# Patient Record
Sex: Male | Born: 1960 | Race: Black or African American | Hispanic: No | Marital: Single | Smoking: Former smoker
Health system: Southern US, Community
[De-identification: ages and names within clinical notes are randomized; demographics above are authoritative.]

## PROBLEM LIST (undated history)

## (undated) DIAGNOSIS — C801 Malignant (primary) neoplasm, unspecified: Secondary | ICD-10-CM

## (undated) DIAGNOSIS — M1990 Unspecified osteoarthritis, unspecified site: Secondary | ICD-10-CM

## (undated) DIAGNOSIS — F419 Anxiety disorder, unspecified: Secondary | ICD-10-CM

## (undated) HISTORY — PX: OTHER SURGICAL HISTORY: SHX169

## (undated) HISTORY — PX: THYROID SURGERY: SHX805

## (undated) HISTORY — PX: HERNIA REPAIR: SHX51

---

## 2013-01-06 ENCOUNTER — Emergency Department (HOSPITAL_COMMUNITY)
Admission: EM | Admit: 2013-01-06 | Discharge: 2013-01-06 | Disposition: A | Discharge status: Home / Self Care | Payer: Self-pay | Attending: Emergency Medicine | Admitting: Emergency Medicine

## 2013-01-06 ENCOUNTER — Emergency Department (HOSPITAL_COMMUNITY): Payer: Self-pay

## 2013-01-06 ENCOUNTER — Encounter (HOSPITAL_COMMUNITY): Payer: Self-pay

## 2013-01-06 DIAGNOSIS — Z79899 Other long term (current) drug therapy: Secondary | ICD-10-CM | POA: Insufficient documentation

## 2013-01-06 DIAGNOSIS — Z8739 Personal history of other diseases of the musculoskeletal system and connective tissue: Secondary | ICD-10-CM | POA: Insufficient documentation

## 2013-01-06 DIAGNOSIS — Y939 Activity, unspecified: Secondary | ICD-10-CM | POA: Insufficient documentation

## 2013-01-06 DIAGNOSIS — X58XXXA Exposure to other specified factors, initial encounter: Secondary | ICD-10-CM | POA: Insufficient documentation

## 2013-01-06 DIAGNOSIS — Z85850 Personal history of malignant neoplasm of thyroid: Secondary | ICD-10-CM | POA: Insufficient documentation

## 2013-01-06 DIAGNOSIS — S90211A Contusion of right great toe with damage to nail, initial encounter: Secondary | ICD-10-CM

## 2013-01-06 DIAGNOSIS — S90129A Contusion of unspecified lesser toe(s) without damage to nail, initial encounter: Secondary | ICD-10-CM | POA: Insufficient documentation

## 2013-01-06 DIAGNOSIS — Y929 Unspecified place or not applicable: Secondary | ICD-10-CM | POA: Insufficient documentation

## 2013-01-06 HISTORY — DX: Malignant (primary) neoplasm, unspecified: C80.1

## 2013-01-06 HISTORY — DX: Unspecified osteoarthritis, unspecified site: M19.90

## 2013-01-06 NOTE — ED Provider Notes (Signed)
CSN: [id number]     Arrival date & time 01/06/13  1427 History   First MD Initiated Contact with Patient 01/06/13 1516     Chief Complaint  Patient presents with  . Toe Pain   (Consider location/radiation/quality/duration/timing/severity/associated sxs/prior Treatment) Patient is a 52 y.o. male presenting with toe pain. The history is provided by the patient.  Toe Pain This is a new problem. The current episode started more than 1 month ago. The problem occurs constantly. Pertinent negatives include no chills, fever, headaches, nausea or vomiting.   Divine Straughter is a 52 y.o. male who presents to the ED with discolored toe nail of the right great toe. He injured the to about a month ago and since then the nail has been changing color. The nail is loose. He came today to be sure there is noting bad going on.   Past Medical History  Diagnosis Date  . Arthritis   . Cancer     thyroid cancer   Past Surgical History  Procedure Laterality Date  . Thyroid surgery     No family history on file. History  Substance Use Topics  . Smoking status: Never Smoker   . Smokeless tobacco: Not on file  . Alcohol Use: Yes     Comment: occ    Review of Systems  Constitutional: Negative for fever and chills.  Gastrointestinal: Negative for nausea and vomiting.  Skin: Positive for wound.  Neurological: Negative for headaches.  Psychiatric/Behavioral: The patient is not nervous/anxious.     Allergies  Review of patient's allergies indicates no known allergies.  Home Medications   Current Outpatient Rx  Name  Route  Sig  Dispense  Refill  . calcitRIOL (ROCALTROL) 0.5 MCG capsule   Oral   Take 0.5 mcg by mouth daily.         . Calcium Carbonate-Vitamin D (CALCIUM 600 + D PO)   Oral   Take 1 tablet by mouth daily.         Marland Kitchen levothyroxine (SYNTHROID, LEVOTHROID) 125 MCG tablet   Oral   Take 125 mcg by mouth daily before breakfast.          BP 116/61  Pulse 82  Temp(Src) 97.9  F (36.6 C) (Oral)  Resp 18  Ht 6' (1.829 m)  Wt 165 lb (74.844 kg)  BMI 22.37 kg/m2  SpO2 99% Physical Exam  Nursing note and vitals reviewed. Constitutional: He is oriented to person, place, and time. He appears well-developed and well-nourished. No distress.  HENT:  Head: Normocephalic.  Eyes: EOM are normal.  Neck: Neck supple.  Cardiovascular: Normal rate.   Pulmonary/Chest: Effort normal.  Musculoskeletal:       Right foot: He exhibits normal range of motion, no tenderness, no swelling, no deformity and no laceration.       Feet:  Right great toe with yellow/blue color under toe nail.   Neurological: He is alert and oriented to person, place, and time. No cranial nerve deficit.  Skin: Skin is warm and dry.  Psychiatric: He has a normal mood and affect. His behavior is normal.    ED Course  Procedures   Using cautery stick make small hole in nail. No drainage after puncture.  Dg Toe Great Right  01/06/2013   *RADIOLOGY REPORT*  Clinical Data: Injury 1 month ago.  Discoloration great toe.  RIGHT GREAT TOE  Comparison: None.  Findings: Soft tissue prominence right great toe may indicate normal finding or cellulitis.  No findings  of osteomyelitis or fracture.  Prior hallux valgus surgery with screw in place.  IMPRESSION: Soft tissue prominence right great toe may indicate normal finding or cellulitis.  No findings of osteomyelitis or fracture.  Prior hallux valgus surgery with screw in place.   Original Report Authenticated By: Lacy Duverney, M.D.    MDM  52 y.o. male with contusion to the right great toe, subungual hematoma. I have reviewed this patient's vital signs, nurses notes, appropriate labs and imaging.  I have discussed findings with the patient and plan of care. He voices understanding.     [facility] Orlene Och, NP 01/06/13 1700

## 2013-01-06 NOTE — ED Notes (Signed)
Pt reports r great toe started out with dark discoloration back in July.  Reports now toenail is coming loose from the toenail bed.  Denies any pain.

## 2013-01-06 NOTE — ED Notes (Addendum)
Pt presents with discoloration to right great toenail. Pt states he first noticed color change 1-2 months ago. Pt denies injury.

## 2013-01-07 NOTE — ED Provider Notes (Signed)
Medical screening examination/treatment/procedure(s) were performed by non-physician practitioner and as supervising physician I was immediately available for consultation/collaboration.   Laray Anger, DO 01/07/13 1424

## 2013-06-01 ENCOUNTER — Ambulatory Visit (INDEPENDENT_AMBULATORY_CARE_PROVIDER_SITE_OTHER): Payer: Medicaid Other | Admitting: Internal Medicine

## 2013-06-01 ENCOUNTER — Encounter: Payer: Self-pay | Admitting: Internal Medicine

## 2013-06-01 VITALS — BP 122/76 | HR 78 | Temp 98.3°F | Resp 14 | Ht 72.0 in | Wt 156.6 lb

## 2013-06-01 DIAGNOSIS — C73 Malignant neoplasm of thyroid gland: Secondary | ICD-10-CM

## 2013-06-01 DIAGNOSIS — E890 Postprocedural hypothyroidism: Secondary | ICD-10-CM

## 2013-06-01 LAB — T4, FREE: Free T4: 1.27 ng/dL (ref 0.60–1.60)

## 2013-06-01 LAB — TSH: TSH: 0.13 u[IU]/mL — ABNORMAL LOW (ref 0.35–5.50)

## 2013-06-01 MED ORDER — LEVOTHYROXINE SODIUM 100 MCG PO TABS: 100.0000 ug | ORAL_TABLET | Freq: Every day | ORAL | Status: DC

## 2013-06-01 NOTE — Progress Notes (Signed)
Patient ID: Alexander Horne, male   DOB: 12-Nov-1960, 53 y.o.   MRN: [id number]   HPI  Alexander Horne is a 53 y.o.-year-old male, referred by his PCP, Dr.Robyn Baird Cancer, for evaluation for thyroid cancer in remission and  post-surgical hypothyroidism.  Pt. has been dx with thyroid cancer in 05/13/2012 in [place] - no records. In 06/2012 >> thyroidectomy. No followup afterwards.  The cancer was an incidental finding on the cervical CT for neck pain. He remembers being told that the cancer was very small. He also remembers that his LN were negative. He did not have to have RAI tx afterwards.   He has postsurgical hypothyroidism and is on Levothyroxine 125 mcg, taken: - fasting - he was waking up at 4 am just to take his levothyroxine - with water - separated by 5h from b'fast  - separated by >4h from calcium - no iron, PPIs - takes MVI after b'fast, 5h after Levothyroxine  I reviewed pt's thyroid tests:  04/26/2013: TSH 0.011  After this result returned, the patient was advised to change the dose of his levothyroxine from 125 to 112 mcg daily. He has done this, however ran out of the one-month prescription 2 days ago. He is taking levothyroxine 125 for the last 2 days.  Pt denies feeling nodules in neck, + hoarseness - after the surgery (L vocal cord weak), dysphagia/odynophagia, SOB with lying down.  Pt describes: - no heat/cold intolerance - + weight loss (196 in 04/2009 >> 156 now). He eats a lot but does not gain weight.  - no palpitations - no fatigue - no diarrhea/ no constipation - + dry skin on knees - no hair falling - no depression  She has no FH of thyroid disorders. No FH of thyroid cancer. No h/o radiation tx to head or neck.  ROS: Constitutional: see HPI Eyes: no blurry vision, no xerophthalmia ENT: no sore throat, + nodules palpated in throat, no dysphagia/odynophagia, no hoarseness Cardiovascular: no CP/SOB/palpitations/leg swelling Respiratory: no  cough/SOB Gastrointestinal: no N/V/D/C Musculoskeletal: + muscle/+ joint aches Skin: no rashes Neurological: no tremors/numbness/tingling/dizziness Psychiatric: + both depression/anxiety  Past Medical History  Diagnosis Date  . Arthritis   . Cancer     thyroid cancer   Past Surgical History  Procedure Laterality Date  . Thyroid surgery     History   Social History  . Marital Status: Single    Spouse Name: N/A    Number of Children: 3   Occupational History  . Disabled - severe hip arthritis   Social History Main Topics  . Smoking status: Quit in 1992  . Smokeless tobacco: Not on file  . Alcohol Use: Yes     Comment: Vodka - weekend - several drinks  . Drug Use: Yes, marijuana - daily  Exercise: walk, bike - daily  Current Outpatient Prescriptions on File Prior to Visit  Medication Sig Dispense Refill  . Calcium Carbonate-Vitamin D (CALCIUM 600 + D PO) Take 1 tablet by mouth daily.       No current facility-administered medications on file prior to visit.   No Known Allergies No family history on file.  PE: BP 122/76  Pulse 78  Temp(Src) 98.3 F (36.8 C)  Resp 14  Ht 6' (1.829 m)  Wt 156 lb 9.6 oz (71.033 kg)  BMI 21.23 kg/m2  SpO2 97% Wt Readings from Last 3 Encounters:  06/01/13 156 lb 9.6 oz (71.033 kg)  01/06/13 165 lb (74.844 kg)   Constitutional: overweight, in NAD  Eyes: PERRLA, EOMI, no exophthalmos ENT: moist mucous membranes, no thyromegaly, no cervical lymphadenopathy Cardiovascular: RRR, No MRG Respiratory: CTA B Gastrointestinal: abdomen soft, NT, ND, BS+ Musculoskeletal: no deformities, strength intact in all 4 Skin: moist, warm, no rashes Neurological: no tremor with outstretched hands, DTR normal in all 4  ASSESSMENT: 1. Hypothyroidism  PLAN:  1. Patient with postsurgical hypothyroidism after total thyroidectomy for low risk ThyCA. He will bring me records from his surgeon in [place].  - he is on levothyroxine therapy, with a recent  very suppressed TSH (on a dose that was not changed or verified since starting the med, per his report). He has unintentional weight loss on this dose.  - We discussed about correct intake of levothyroxine, fasting, with water, separated by at least 30 minutes from breakfast, and separated by more than 4 hours from calcium, iron, multivitamins, acid reflux medications (PPIs). I advised him that he does not need to set his alarm at 4 pm only to take the Levothyroxine! - He does not appear to have a thyroid nodules, or neck compression symptoms - we'll check thyroid tests today: TSH, free T4 - will need a new Rx for Levothyroxine  - I will see him back in 6 months, but he likely will need repeat labs sooner  Office Visit on 06/01/2013  Component Date Value Range Status  . TSH 06/01/2013 0.13* 0.35 - 5.50 uIU/mL Final  . Free T4 06/01/2013 1.27  0.60 - 1.60 ng/dL Final   Will start Levothyroxine 100 mcg >> recheck TFTs in 5 weeks.

## 2013-06-01 NOTE — Patient Instructions (Signed)
Please stop at the lab. Depending the results, we will call in a new prescription of Levothyroxine. Please return in 6 months.

## 2013-06-13 ENCOUNTER — Other Ambulatory Visit: Payer: Self-pay | Admitting: Orthopaedic Surgery

## 2013-06-14 ENCOUNTER — Encounter (HOSPITAL_COMMUNITY): Payer: Self-pay | Admitting: Pharmacy Technician

## 2013-06-19 NOTE — Pre-Procedure Instructions (Signed)
Alexander Horne  06/19/2013   Your procedure is scheduled on: Tuesday, March 3.  Report to [facility], Main Entrance / Entrance "A" at 10:20 AM.  Call this number if you have problems the morning of surgery: [phone]   Remember:   Do not eat food or drink liquids after midnight Monday.   Take these medicines the morning of surgery with A SIP OF WATER: levothyroxine (SYNTHROID, LEVOTHROID).              Stop taking Omega-3 Fatty Acids and Vitamins.    Do not wear jewelry, make-up or nail polish.  Do not wear lotions, powders, or perfumes. You may wear deodorant.             Men may shave face and neck.  Do not bring valuables to the hospital.  [facility] is not responsible for any belongings or valuables.               Contacts, dentures or bridgework may not be worn into surgery.  Leave suitcase in the car. After surgery it may be brought to your room.  For patients admitted to the hospital, discharge time is determined by your treatment team.               Special Instructions: [facility] - Preparing for Surgery  Before surgery, you can play an important role.  Because skin is not sterile, your skin needs to be as free of germs as possible.  You can reduce the number of germs on you skin by washing with CHG (chlorahexidine gluconate) soap before surgery.  CHG is an antiseptic cleaner which kills germs and bonds with the skin to continue killing germs even after washing.  Please DO NOT use if you have an allergy to CHG or antibacterial soaps.  If your skin becomes reddened/irritated stop using the CHG and inform your nurse when you arrive at Short Stay.  Do not shave (including legs and underarms) for at least 48 hours prior to the first CHG shower.  You may shave your face.  Please follow these instructions carefully:   1.  Shower with CHG Soap the night before surgery and the morning of Surgery.  2.  If you choose to wash your hair, wash your hair first as  usual with your normal shampoo.  3.  After you shampoo, rinse your hair and body thoroughly to remove the  Shampoo.  4.  Use CHG as you would any other liquid soap.  You can apply chg directlyto the skin and wash gently with scrungie or a clean washcloth.  5.  Apply the CHG Soap to your body ONLY FROM THE NECK DOWN.        Do not use on open wounds or open sores.  Avoid contact with your eyes, ears and genitals (private parts).  Wash genitals (private parts with your normal soap.  6.  Wash thoroughly, paying special attention to the area where your surgery  will be performed.  7.  Thoroughly rinse your body with warm water from the neck down.  8.  DO NOT shower/wash with your normal soap after using and rinsing of the CHG Soap.  9.  Pat yourself dry with a clean towel.            10.  Wear clean pajamas.            11.  Place clean sheets on your bed the night of your first shower and  do not sleep with pets.  Day of Surgery  Do not apply any lotions/deodorants the morning of surgery. Please wear clean clothes to the hospital/surgery center.     Please read over the following fact sheets that you were given: Pain Booklet, Coughing and Deep Breathing, Blood Transfusion Information and Surgical Site Infection Prevention

## 2013-06-20 ENCOUNTER — Inpatient Hospital Stay (HOSPITAL_COMMUNITY): Admission: RE | Admit: 2013-06-20 | Payer: Medicaid Other | Source: Ambulatory Visit

## 2013-06-21 NOTE — H&P (Signed)
TOTAL HIP ADMISSION H&P  Patient is admitted for left total hip arthroplasty.  Subjective:  Chief Complaint: left hip pain  HPI: Alexander Horne, 53 y.o. male, has a history of pain and functional disability in the left hip(s) due to arthritis and patient has failed non-surgical conservative treatments for greater than 12 weeks to include NSAID's and/or analgesics, flexibility and strengthening excercises, use of assistive devices, weight reduction as appropriate and activity modification.  Onset of symptoms was gradual starting 4 years ago with gradually worsening course since that time.The patient noted no past surgery on the left hip(s).  Patient currently rates pain in the left hip at 9 out of 10 with activity. Patient has night pain, worsening of pain with activity and weight bearing, trendelenberg gait, pain that interfers with activities of daily living and pain with passive range of motion. Patient has evidence of subchondral sclerosis, periarticular osteophytes and joint space narrowing by imaging studies. This condition presents safety issues increasing the risk of falls. This patient has had no.  There is no current active infection.  Patient Active Problem List   Diagnosis Date Noted  . Thyroid cancer 06/01/2013  . Post-surgical hypothyroidism 06/01/2013   Past Medical History  Diagnosis Date  . Arthritis   . Cancer     thyroid cancer    Past Surgical History  Procedure Laterality Date  . Thyroid surgery      No prescriptions prior to admission   No Known Allergies  History  Substance Use Topics  . Smoking status: Never Smoker   . Smokeless tobacco: Not on file  . Alcohol Use: Yes     Comment: occ    No family history on file.   Review of Systems  Constitutional: Negative.   HENT: Negative.   Eyes: Negative.   Respiratory: Negative.   Cardiovascular: Negative.   Gastrointestinal: Negative.   Genitourinary: Negative.   Musculoskeletal: Positive for joint pain.   Skin: Negative.   Neurological: Negative.   Endo/Heme/Allergies: Negative.   Psychiatric/Behavioral: Negative.     Objective:  Physical Exam  Constitutional: He appears well-developed.  HENT:  Head: Normocephalic.  Eyes: Pupils are equal, round, and reactive to light.  Neck: Normal range of motion.  Cardiovascular: Normal rate.   Respiratory: Effort normal.  GI: Soft.  Musculoskeletal:  .  Left hip examination: Very painful with any attempts of rotation which is extremely limited.  Forward flexion to 90 with a 10 hip flexion contracture.  Good neurovascular status distally.  Negative Homans.  Good sensory motor function  Neurological: He is alert.  Skin: Skin is warm.  Psychiatric: He has a normal mood and affect.    Vital signs in last 24 hours:    Labs:   Estimated body mass index is 21.23 kg/(m^2) as calculated from the following:   Height as of 06/01/13: 6' (1.829 m).   Weight as of 06/01/13: 71.033 kg (156 lb 9.6 oz).   Imaging Review Plain radiographs demonstrate severe degenerative joint disease of the left hip(s). The bone quality appears to be good for age and reported activity level.  Assessment/Plan:  End stage arthritis, left hip(s)  The patient history, physical examination, clinical judgement of the provider and imaging studies are consistent with end stage degenerative joint disease of the left hip(s) and total hip arthroplasty is deemed medically necessary. The treatment options including medical management, injection therapy, arthroscopy and arthroplasty were discussed at length. The risks and benefits of total hip arthroplasty were presented and reviewed.  The risks due to aseptic loosening, infection, stiffness, dislocation/subluxation,  thromboembolic complications and other imponderables were discussed.  The patient acknowledged the explanation, agreed to proceed with the plan and consent was signed. Patient is being admitted for inpatient treatment for  surgery, pain control, PT, OT, prophylactic antibiotics, VTE prophylaxis, progressive ambulation and ADL's and discharge planning.The patient is planning to be discharged home with home health services

## 2013-06-22 ENCOUNTER — Encounter (HOSPITAL_COMMUNITY): Payer: Self-pay

## 2013-06-22 ENCOUNTER — Encounter (HOSPITAL_COMMUNITY)
Admission: RE | Admit: 2013-06-22 | Discharge: 2013-06-22 | Disposition: A | Discharge status: Home / Self Care | Payer: Medicaid Other | Source: Ambulatory Visit | Attending: Orthopaedic Surgery | Admitting: Orthopaedic Surgery

## 2013-06-22 DIAGNOSIS — Z01810 Encounter for preprocedural cardiovascular examination: Secondary | ICD-10-CM | POA: Insufficient documentation

## 2013-06-22 DIAGNOSIS — Z01812 Encounter for preprocedural laboratory examination: Secondary | ICD-10-CM | POA: Insufficient documentation

## 2013-06-22 DIAGNOSIS — Z01818 Encounter for other preprocedural examination: Secondary | ICD-10-CM | POA: Insufficient documentation

## 2013-06-22 HISTORY — DX: Anxiety disorder, unspecified: F41.9

## 2013-06-22 LAB — URINALYSIS, ROUTINE W REFLEX MICROSCOPIC
Bilirubin Urine: NEGATIVE
Glucose, UA: NEGATIVE mg/dL
Hgb urine dipstick: NEGATIVE
Ketones, ur: NEGATIVE mg/dL
Leukocytes, UA: NEGATIVE
NITRITE: NEGATIVE
PH: 6.5 (ref 5.0–8.0)
PROTEIN: NEGATIVE mg/dL
Specific Gravity, Urine: 1.013 (ref 1.005–1.030)
Urobilinogen, UA: 0.2 mg/dL (ref 0.0–1.0)

## 2013-06-22 LAB — CBC WITH DIFFERENTIAL/PLATELET
BASOS ABS: 0 10*3/uL (ref 0.0–0.1)
Basophils Relative: 0 % (ref 0–1)
Eosinophils Absolute: 0.1 10*3/uL (ref 0.0–0.7)
Eosinophils Relative: 1 % (ref 0–5)
HCT: 36 % — ABNORMAL LOW (ref 39.0–52.0)
Hemoglobin: 12.8 g/dL — ABNORMAL LOW (ref 13.0–17.0)
LYMPHS PCT: 28 % (ref 12–46)
Lymphs Abs: 2.1 10*3/uL (ref 0.7–4.0)
MCH: 33.5 pg (ref 26.0–34.0)
MCHC: 35.6 g/dL (ref 30.0–36.0)
MCV: 94.2 fL (ref 78.0–100.0)
Monocytes Absolute: 0.8 10*3/uL (ref 0.1–1.0)
Monocytes Relative: 10 % (ref 3–12)
NEUTROS ABS: 4.6 10*3/uL (ref 1.7–7.7)
Neutrophils Relative %: 61 % (ref 43–77)
PLATELETS: 308 10*3/uL (ref 150–400)
RBC: 3.82 MIL/uL — ABNORMAL LOW (ref 4.22–5.81)
RDW: 13.8 % (ref 11.5–15.5)
WBC: 7.6 10*3/uL (ref 4.0–10.5)

## 2013-06-22 LAB — APTT: APTT: 32 s (ref 24–37)

## 2013-06-22 LAB — BASIC METABOLIC PANEL
BUN: 14 mg/dL (ref 6–23)
CHLORIDE: 104 meq/L (ref 96–112)
CO2: 28 mEq/L (ref 19–32)
Calcium: 9.3 mg/dL (ref 8.4–10.5)
Creatinine, Ser: 1.14 mg/dL (ref 0.50–1.35)
GFR calc non Af Amer: 73 mL/min — ABNORMAL LOW (ref >90)
GFR, EST AFRICAN AMERICAN: 85 mL/min — AB (ref >90)
Glucose, Bld: 85 mg/dL (ref 70–99)
POTASSIUM: 4.7 meq/L (ref 3.7–5.3)
SODIUM: 143 meq/L (ref 137–147)

## 2013-06-22 LAB — SURGICAL PCR SCREEN
MRSA, PCR: NEGATIVE
STAPHYLOCOCCUS AUREUS: NEGATIVE

## 2013-06-22 LAB — TYPE AND SCREEN
ABO/RH(D): B POS
Antibody Screen: NEGATIVE

## 2013-06-22 LAB — PROTIME-INR
INR: 0.94 (ref 0.00–1.49)
Prothrombin Time: 12.4 seconds (ref 11.6–15.2)

## 2013-06-22 LAB — ABO/RH: ABO/RH(D): B POS

## 2013-06-22 NOTE — Progress Notes (Signed)
Denies any cardiac issues.   Sees Gertie Exon @ [facility].Marland Kitchen  DA

## 2013-06-26 MED ORDER — CEFAZOLIN SODIUM-DEXTROSE 2-3 GM-% IV SOLR
2.0000 g | INTRAVENOUS | Status: AC
  Administered 2013-06-27: 2 g via INTRAVENOUS
  Filled 2013-06-26: qty 50

## 2013-06-26 NOTE — Progress Notes (Signed)
Pt made aware of time change and needs to be here @ 0600.  DA

## 2013-06-27 ENCOUNTER — Encounter (HOSPITAL_COMMUNITY): Payer: Medicaid Other | Admitting: Anesthesiology

## 2013-06-27 ENCOUNTER — Encounter (HOSPITAL_COMMUNITY): Payer: Self-pay | Admitting: Surgery

## 2013-06-27 ENCOUNTER — Inpatient Hospital Stay (HOSPITAL_COMMUNITY): Payer: Medicaid Other | Admitting: Anesthesiology

## 2013-06-27 ENCOUNTER — Inpatient Hospital Stay (HOSPITAL_COMMUNITY)
Admission: RE | Admit: 2013-06-27 | Discharge: 2013-06-29 | DRG: 470 | Disposition: A | Discharge status: Home Health | Payer: Medicaid Other | Source: Ambulatory Visit | Attending: Orthopaedic Surgery | Admitting: Orthopaedic Surgery

## 2013-06-27 ENCOUNTER — Encounter (HOSPITAL_COMMUNITY)
Admission: RE | Disposition: A | Discharge status: Home Health | Payer: Self-pay | Source: Ambulatory Visit | Attending: Orthopaedic Surgery

## 2013-06-27 ENCOUNTER — Inpatient Hospital Stay (HOSPITAL_COMMUNITY): Payer: Medicaid Other

## 2013-06-27 DIAGNOSIS — M1610 Unilateral primary osteoarthritis, unspecified hip: Principal | ICD-10-CM | POA: Diagnosis present

## 2013-06-27 DIAGNOSIS — M169 Osteoarthritis of hip, unspecified: Secondary | ICD-10-CM | POA: Diagnosis present

## 2013-06-27 DIAGNOSIS — E890 Postprocedural hypothyroidism: Secondary | ICD-10-CM | POA: Diagnosis present

## 2013-06-27 HISTORY — PX: TOTAL HIP ARTHROPLASTY: SHX124

## 2013-06-27 SURGERY — ARTHROPLASTY, HIP, TOTAL, ANTERIOR APPROACH
Anesthesia: General | Site: Hip | Laterality: Left

## 2013-06-27 MED ORDER — ROCURONIUM BROMIDE 100 MG/10ML IV SOLN
INTRAVENOUS | Status: DC | PRN
  Administered 2013-06-27: 50 mg via INTRAVENOUS

## 2013-06-27 MED ORDER — PROPOFOL 10 MG/ML IV BOLUS
INTRAVENOUS | Status: DC | PRN
Start: 2013-06-27 — End: 2013-06-27
  Administered 2013-06-27: 180 mg via INTRAVENOUS
  Administered 2013-06-27: 20 mg via INTRAVENOUS

## 2013-06-27 MED ORDER — DOCUSATE SODIUM 100 MG PO CAPS
100.0000 mg | ORAL_CAPSULE | Freq: Two times a day (BID) | ORAL | Status: DC
  Administered 2013-06-27 – 2013-06-29 (×4): 100 mg via ORAL
  Filled 2013-06-27 (×7): qty 1

## 2013-06-27 MED ORDER — LACTATED RINGERS IV SOLN: INTRAVENOUS | Status: DC

## 2013-06-27 MED ORDER — METOCLOPRAMIDE HCL 5 MG PO TABS
5.0000 mg | ORAL_TABLET | Freq: Three times a day (TID) | ORAL | Status: DC | PRN
  Filled 2013-06-27: qty 2

## 2013-06-27 MED ORDER — ONDANSETRON HCL 4 MG/2ML IJ SOLN
INTRAMUSCULAR | Status: DC | PRN
Start: 2013-06-27 — End: 2013-06-27
  Administered 2013-06-27: 4 mg via INTRAVENOUS

## 2013-06-27 MED ORDER — PROPOFOL 10 MG/ML IV BOLUS
INTRAVENOUS | Status: AC
  Filled 2013-06-27: qty 20

## 2013-06-27 MED ORDER — MENTHOL 3 MG MT LOZG: 1.0000 | LOZENGE | OROMUCOSAL | Status: DC | PRN

## 2013-06-27 MED ORDER — CHLORHEXIDINE GLUCONATE 4 % EX LIQD
60.0000 mL | Freq: Once | CUTANEOUS | Status: DC
  Filled 2013-06-27: qty 60

## 2013-06-27 MED ORDER — METHOCARBAMOL 100 MG/ML IJ SOLN
500.0000 mg | INTRAMUSCULAR | Status: AC
  Administered 2013-06-27: 500 mg via INTRAVENOUS
  Filled 2013-06-27: qty 5

## 2013-06-27 MED ORDER — CEFAZOLIN SODIUM-DEXTROSE 2-3 GM-% IV SOLR
2.0000 g | Freq: Four times a day (QID) | INTRAVENOUS | Status: AC
  Administered 2013-06-27 (×2): 2 g via INTRAVENOUS
  Filled 2013-06-27 (×2): qty 50

## 2013-06-27 MED ORDER — LACTATED RINGERS IV SOLN
INTRAVENOUS | Status: DC | PRN
  Administered 2013-06-27 (×2): via INTRAVENOUS

## 2013-06-27 MED ORDER — ALUM & MAG HYDROXIDE-SIMETH 200-200-20 MG/5ML PO SUSP: 30.0000 mL | ORAL | Status: DC | PRN

## 2013-06-27 MED ORDER — BISACODYL 5 MG PO TBEC: 5.0000 mg | DELAYED_RELEASE_TABLET | Freq: Every day | ORAL | Status: DC | PRN

## 2013-06-27 MED ORDER — METHOCARBAMOL 500 MG PO TABS
500.0000 mg | ORAL_TABLET | Freq: Four times a day (QID) | ORAL | Status: DC | PRN
  Administered 2013-06-28 (×3): 500 mg via ORAL
  Filled 2013-06-27 (×4): qty 1

## 2013-06-27 MED ORDER — SODIUM CHLORIDE 0.9 % IV SOLN
12.5000 mg | Freq: Once | INTRAVENOUS | Status: DC
  Administered 2013-06-27: 12.5 mg via INTRAVENOUS

## 2013-06-27 MED ORDER — FENTANYL CITRATE 0.05 MG/ML IJ SOLN
INTRAMUSCULAR | Status: AC
  Filled 2013-06-27: qty 5

## 2013-06-27 MED ORDER — PROMETHAZINE HCL 25 MG/ML IJ SOLN: 6.2500 mg | INTRAMUSCULAR | Status: DC | PRN

## 2013-06-27 MED ORDER — ONDANSETRON HCL 4 MG/2ML IJ SOLN
4.0000 mg | Freq: Four times a day (QID) | INTRAMUSCULAR | Status: DC | PRN
  Administered 2013-06-28 – 2013-06-29 (×2): 4 mg via INTRAVENOUS
  Filled 2013-06-27 (×2): qty 2

## 2013-06-27 MED ORDER — OXYCODONE HCL 5 MG PO TABS
5.0000 mg | ORAL_TABLET | Freq: Once | ORAL | Status: AC | PRN
  Administered 2013-06-27: 5 mg via ORAL

## 2013-06-27 MED ORDER — GLYCOPYRROLATE 0.2 MG/ML IJ SOLN
INTRAMUSCULAR | Status: DC | PRN
  Administered 2013-06-27: 0.6 mg via INTRAVENOUS

## 2013-06-27 MED ORDER — FERROUS SULFATE 325 (65 FE) MG PO TABS
325.0000 mg | ORAL_TABLET | Freq: Two times a day (BID) | ORAL | Status: DC
  Administered 2013-06-27 – 2013-06-29 (×4): 325 mg via ORAL
  Filled 2013-06-27 (×6): qty 1

## 2013-06-27 MED ORDER — ARTIFICIAL TEARS OP OINT
TOPICAL_OINTMENT | OPHTHALMIC | Status: DC | PRN
  Administered 2013-06-27: 1 via OPHTHALMIC

## 2013-06-27 MED ORDER — METHOCARBAMOL 100 MG/ML IJ SOLN
500.0000 mg | Freq: Four times a day (QID) | INTRAVENOUS | Status: DC | PRN
  Filled 2013-06-27: qty 5

## 2013-06-27 MED ORDER — ARTIFICIAL TEARS OP OINT
TOPICAL_OINTMENT | OPHTHALMIC | Status: AC
  Filled 2013-06-27: qty 3.5

## 2013-06-27 MED ORDER — OXYCODONE HCL 5 MG PO TABS
ORAL_TABLET | ORAL | Status: AC
  Filled 2013-06-27: qty 1

## 2013-06-27 MED ORDER — HYDROMORPHONE HCL PF 1 MG/ML IJ SOLN
INTRAMUSCULAR | Status: AC
  Filled 2013-06-27: qty 1

## 2013-06-27 MED ORDER — NEOSTIGMINE METHYLSULFATE 1 MG/ML IJ SOLN
INTRAMUSCULAR | Status: AC
  Filled 2013-06-27: qty 10

## 2013-06-27 MED ORDER — LEVOTHYROXINE SODIUM 100 MCG PO TABS
100.0000 ug | ORAL_TABLET | Freq: Every day | ORAL | Status: DC
  Administered 2013-06-28 – 2013-06-29 (×2): 100 ug via ORAL
  Filled 2013-06-27 (×3): qty 1

## 2013-06-27 MED ORDER — ASPIRIN EC 325 MG PO TBEC
325.0000 mg | DELAYED_RELEASE_TABLET | Freq: Two times a day (BID) | ORAL | Status: DC
  Administered 2013-06-28 – 2013-06-29 (×3): 325 mg via ORAL
  Filled 2013-06-27 (×5): qty 1

## 2013-06-27 MED ORDER — ONDANSETRON HCL 4 MG/2ML IJ SOLN
INTRAMUSCULAR | Status: AC
  Filled 2013-06-27: qty 2

## 2013-06-27 MED ORDER — LIDOCAINE HCL (CARDIAC) 20 MG/ML IV SOLN
INTRAVENOUS | Status: AC
  Filled 2013-06-27: qty 5

## 2013-06-27 MED ORDER — LACTATED RINGERS IV SOLN
INTRAVENOUS | Status: DC
  Administered 2013-06-27: 75 mL via INTRAVENOUS
  Administered 2013-06-28: via INTRAVENOUS

## 2013-06-27 MED ORDER — LIDOCAINE HCL (CARDIAC) 20 MG/ML IV SOLN
INTRAVENOUS | Status: DC | PRN
  Administered 2013-06-27: 100 mg via INTRAVENOUS

## 2013-06-27 MED ORDER — MIDAZOLAM HCL 2 MG/2ML IJ SOLN
INTRAMUSCULAR | Status: AC
  Filled 2013-06-27: qty 2

## 2013-06-27 MED ORDER — 0.9 % SODIUM CHLORIDE (POUR BTL) OPTIME
TOPICAL | Status: DC | PRN
  Administered 2013-06-27: 1000 mL

## 2013-06-27 MED ORDER — HYDROCODONE-ACETAMINOPHEN 7.5-325 MG PO TABS
1.0000 | ORAL_TABLET | ORAL | Status: DC | PRN
  Administered 2013-06-27 – 2013-06-28 (×4): 2 via ORAL
  Filled 2013-06-27 (×4): qty 2

## 2013-06-27 MED ORDER — NEOSTIGMINE METHYLSULFATE 1 MG/ML IJ SOLN
INTRAMUSCULAR | Status: DC | PRN
  Administered 2013-06-27: 4 mg via INTRAVENOUS

## 2013-06-27 MED ORDER — OXYCODONE HCL 5 MG/5ML PO SOLN: 5.0000 mg | Freq: Once | ORAL | Status: AC | PRN

## 2013-06-27 MED ORDER — MEPERIDINE HCL 25 MG/ML IJ SOLN
INTRAMUSCULAR | Status: AC
  Administered 2013-06-27: 12.5 mg via INTRAVENOUS
  Filled 2013-06-27: qty 1

## 2013-06-27 MED ORDER — MEPERIDINE HCL 25 MG/ML IJ SOLN
12.5000 mg | Freq: Once | INTRAMUSCULAR | Status: AC
Start: 2013-06-27 — End: 2013-06-27
  Administered 2013-06-27: 12.5 mg via INTRAVENOUS

## 2013-06-27 MED ORDER — METOCLOPRAMIDE HCL 5 MG/ML IJ SOLN: 5.0000 mg | Freq: Three times a day (TID) | INTRAMUSCULAR | Status: DC | PRN

## 2013-06-27 MED ORDER — ONDANSETRON HCL 4 MG PO TABS
4.0000 mg | ORAL_TABLET | Freq: Four times a day (QID) | ORAL | Status: DC | PRN
  Administered 2013-06-28: 4 mg via ORAL
  Filled 2013-06-27: qty 1

## 2013-06-27 MED ORDER — PHENOL 1.4 % MT LIQD: 1.0000 | OROMUCOSAL | Status: DC | PRN

## 2013-06-27 MED ORDER — HYDROMORPHONE HCL PF 1 MG/ML IJ SOLN
0.5000 mg | INTRAMUSCULAR | Status: DC | PRN
  Administered 2013-06-27 – 2013-06-28 (×8): 1 mg via INTRAVENOUS
  Filled 2013-06-27 (×8): qty 1

## 2013-06-27 MED ORDER — ACETAMINOPHEN 325 MG PO TABS: 650.0000 mg | ORAL_TABLET | Freq: Four times a day (QID) | ORAL | Status: DC | PRN

## 2013-06-27 MED ORDER — HYDROMORPHONE HCL PF 1 MG/ML IJ SOLN
0.2500 mg | INTRAMUSCULAR | Status: DC | PRN
  Administered 2013-06-27 (×4): 0.5 mg via INTRAVENOUS

## 2013-06-27 MED ORDER — FENTANYL CITRATE 0.05 MG/ML IJ SOLN
INTRAMUSCULAR | Status: DC | PRN
  Administered 2013-06-27: 50 ug via INTRAVENOUS
  Administered 2013-06-27: 100 ug via INTRAVENOUS
  Administered 2013-06-27: 50 ug via INTRAVENOUS
  Administered 2013-06-27: 100 ug via INTRAVENOUS

## 2013-06-27 MED ORDER — ACETAMINOPHEN 650 MG RE SUPP: 650.0000 mg | Freq: Four times a day (QID) | RECTAL | Status: DC | PRN

## 2013-06-27 MED ORDER — GLYCOPYRROLATE 0.2 MG/ML IJ SOLN
INTRAMUSCULAR | Status: AC
  Filled 2013-06-27: qty 3

## 2013-06-27 MED ORDER — MIDAZOLAM HCL 5 MG/5ML IJ SOLN
INTRAMUSCULAR | Status: DC | PRN
  Administered 2013-06-27: 2 mg via INTRAVENOUS

## 2013-06-27 SURGICAL SUPPLY — 48 items
BLADE SAW SGTL 18X1.27X75 (BLADE) ×2 IMPLANT
BLADE SAW SGTL 18X1.27X75MM (BLADE) ×1
BLADE SURG ROTATE 9660 (MISCELLANEOUS) IMPLANT
CAPT HIP PF COP ×3 IMPLANT
CELLS DAT CNTRL 66122 CELL SVR (MISCELLANEOUS) ×1 IMPLANT
COVER SURGICAL LIGHT HANDLE (MISCELLANEOUS) ×3 IMPLANT
DRAPE C-ARM 42X72 X-RAY (DRAPES) ×6 IMPLANT
DRAPE STERI IOBAN 125X83 (DRAPES) ×3 IMPLANT
DRAPE U-SHAPE 47X51 STRL (DRAPES) ×9 IMPLANT
DRSG AQUACEL AG ADV 3.5X10 (GAUZE/BANDAGES/DRESSINGS) ×3 IMPLANT
DURAPREP 26ML APPLICATOR (WOUND CARE) ×3 IMPLANT
ELECT BLADE 4.0 EZ CLEAN MEGAD (MISCELLANEOUS)
ELECT CAUTERY BLADE 6.4 (BLADE) ×3 IMPLANT
ELECT REM PT RETURN 9FT ADLT (ELECTROSURGICAL) ×3
ELECTRODE BLDE 4.0 EZ CLN MEGD (MISCELLANEOUS) IMPLANT
ELECTRODE REM PT RTRN 9FT ADLT (ELECTROSURGICAL) ×1 IMPLANT
FACESHIELD LNG OPTICON STERILE (SAFETY) ×6 IMPLANT
GLOVE BIO SURGEON STRL SZ8 (GLOVE) ×3 IMPLANT
GLOVE BIO SURGEON STRL SZ8.5 (GLOVE) ×3 IMPLANT
GLOVE BIOGEL PI IND STRL 7.5 (GLOVE) ×2 IMPLANT
GLOVE BIOGEL PI IND STRL 8 (GLOVE) ×2 IMPLANT
GLOVE BIOGEL PI IND STRL 8.5 (GLOVE) ×2 IMPLANT
GLOVE BIOGEL PI INDICATOR 7.5 (GLOVE) ×4
GLOVE BIOGEL PI INDICATOR 8 (GLOVE) ×4
GLOVE BIOGEL PI INDICATOR 8.5 (GLOVE) ×4
GOWN PREVENTION PLUS LG XLONG (DISPOSABLE) IMPLANT
GOWN STRL NON-REIN LRG LVL3 (GOWN DISPOSABLE) ×9 IMPLANT
GOWN STRL REIN XL XLG (GOWN DISPOSABLE) ×3 IMPLANT
KIT BASIN OR (CUSTOM PROCEDURE TRAY) ×3 IMPLANT
KIT ROOM TURNOVER OR (KITS) ×3 IMPLANT
MANIFOLD NEPTUNE II (INSTRUMENTS) ×3 IMPLANT
NS IRRIG 1000ML POUR BTL (IV SOLUTION) ×3 IMPLANT
PACK TOTAL JOINT (CUSTOM PROCEDURE TRAY) ×3 IMPLANT
PAD ARMBOARD 7.5X6 YLW CONV (MISCELLANEOUS) ×6 IMPLANT
RTRCTR WOUND ALEXIS 18CM MED (MISCELLANEOUS) ×3
STAPLER VISISTAT 35W (STAPLE) ×3 IMPLANT
SUT ETHIBOND NAB CT1 #1 30IN (SUTURE) ×9 IMPLANT
SUT VIC AB 0 CT1 27 (SUTURE) ×2
SUT VIC AB 0 CT1 27XBRD ANBCTR (SUTURE) ×1 IMPLANT
SUT VIC AB 1 CT1 27 (SUTURE)
SUT VIC AB 1 CT1 27XBRD ANBCTR (SUTURE) IMPLANT
SUT VIC AB 2-0 CT1 27 (SUTURE) ×2
SUT VIC AB 2-0 CT1 TAPERPNT 27 (SUTURE) ×1 IMPLANT
SUT VLOC 180 0 24IN GS25 (SUTURE) ×3 IMPLANT
TOWEL OR 17X24 6PK STRL BLUE (TOWEL DISPOSABLE) ×3 IMPLANT
TOWEL OR 17X26 10 PK STRL BLUE (TOWEL DISPOSABLE) ×6 IMPLANT
TRAY FOLEY CATH 14FR (SET/KITS/TRAYS/PACK) IMPLANT
WATER STERILE IRR 1000ML POUR (IV SOLUTION) ×6 IMPLANT

## 2013-06-27 NOTE — Transfer of Care (Signed)
Immediate Anesthesia Transfer of Care Note  Patient: Alexander Horne  Procedure(s) Performed: Procedure(s): TOTAL HIP ARTHROPLASTY ANTERIOR APPROACH (Left)  Patient Location: PACU  Anesthesia Type:General  Level of Consciousness: awake, alert , oriented and sedated  Airway & Oxygen Therapy: Patient Spontanous Breathing and Patient connected to nasal cannula oxygen  Post-op Assessment: Report given to PACU RN, Post -op Vital signs reviewed and stable and Patient moving all extremities  Post vital signs: Reviewed and stable  Complications: No apparent anesthesia complications

## 2013-06-27 NOTE — Evaluation (Signed)
Physical Therapy Evaluation Patient Details Name: Alexander Horne MRN: [id number] DOB: [birth year] Today's Date: 06/27/2013 Time: 5638-7564 PT Time Calculation (min): 15 min  PT Assessment / Plan / Recommendation History of Present Illness  s/p L direct anterior THR  Clinical Impression  Pt is s/p L direct anterior THA resulting in the deficits listed below (see PT Problem List).  Pt will benefit from skilled PT to increase their independence and safety with mobility to allow discharge to the venue listed below.  Pt mobilizing well POD #0 and plans to return home, states lives alone and will need to be modified independent upon d/c.    PT Assessment  Patient needs continued PT services    Follow Up Recommendations  Home health PT    Does the patient have the potential to tolerate intense rehabilitation      Barriers to Discharge        Equipment Recommendations  Rolling walker with 5" wheels    Recommendations for Other Services     Frequency 7X/week    Precautions / Restrictions Precautions Precautions: None Restrictions Other Position/Activity Restrictions: WBAT   Pertinent Vitals/Pain Max L hip pain, repositioned, RN notified via call bell      Mobility  Bed Mobility Overal bed mobility: Needs Assistance Bed Mobility: Supine to Sit;Sit to Supine Supine to sit: Supervision Sit to supine: Supervision General bed mobility comments: verbal cues for self assist using UEs Transfers Overall transfer level: Needs assistance Equipment used: Rolling walker (2 wheeled) Transfers: Sit to/from Stand Sit to Stand: Min guard General transfer comment: verbal cues for hand placement and L LE forward for pain control Ambulation/Gait Ambulation/Gait assistance: Min guard Ambulation Distance (Feet): 80 Feet Assistive device: Rolling walker (2 wheeled) Gait Pattern/deviations: Step-to pattern;Step-through pattern;Decreased step length - left;Antalgic General Gait Details: verbal  cues for sequence, step length, RW distance; pt very motivated despite pain and PT limited distance for decreasing pain/soreness     Exercises     PT Diagnosis: Abnormality of gait;Acute pain  PT Problem List: Decreased strength;Decreased range of motion;Decreased mobility;Decreased knowledge of use of DME;Pain PT Treatment Interventions: Stair training;Gait training;DME instruction;Functional mobility training;Patient/family education;Therapeutic activities;Therapeutic exercise     PT Goals(Current goals can be found in the care plan section) Acute Rehab PT Goals Patient Stated Goal: return home, get back to his 2 dogs PT Goal Formulation: With patient Time For Goal Achievement: 07/04/13 Potential to Achieve Goals: Good  Visit Information  Last PT Received On: 06/27/13 Assistance Needed: +1 History of Present Illness: s/p L direct anterior THR       Prior Functioning  Home Living Family/patient expects to be discharged to:: Private residence Living Arrangements: Alone Type of Home: House Home Access: Stairs to enter Technical brewer of Steps: 4 Entrance Stairs-Rails: Can reach both;Right;Left Home Layout: One level Home Equipment: None Prior Function Level of Independence: Independent Communication Communication: No difficulties    Cognition  Cognition Arousal/Alertness: Awake/alert Behavior During Therapy: WFL for tasks assessed/performed Overall Cognitive Status: Within Functional Limits for tasks assessed    Extremity/Trunk Assessment Lower Extremity Assessment Lower Extremity Assessment: LLE deficits/detail LLE Deficits / Details: decreased active hip movement against gravity observed    Balance    End of Session PT - End of Session Activity Tolerance: Patient limited by pain Patient left: in bed;with call bell/phone within reach Nurse Communication: Patient requests pain meds (via call bell)  GP     Anelle Parlow,KATHrine E 06/27/2013, 2:39 PM Carmelia Bake,  PT, DPT 06/27/2013 Pager:  [phone]   

## 2013-06-27 NOTE — Plan of Care (Signed)
Problem: Consults Goal: Diagnosis- Total Joint Replacement Primary Total Hip Left     

## 2013-06-27 NOTE — Anesthesia Postprocedure Evaluation (Signed)
Anesthesia Post Note  Patient: Alexander Horne  Procedure(s) Performed: Procedure(s) (LRB): TOTAL HIP ARTHROPLASTY ANTERIOR APPROACH (Left)  Anesthesia type: general  Patient location: PACU  Post pain: Pain level controlled  Post assessment: Patient's Cardiovascular Status Stable  Last Vitals:  Filed Vitals:   06/27/13 1121  BP: 129/88  Pulse: 61  Temp:   Resp: 8    Post vital signs: Reviewed and stable  Level of consciousness: sedated  Complications: No apparent anesthesia complications

## 2013-06-27 NOTE — Op Note (Signed)
PRE-OP DIAGNOSIS:  LEFT HIP DEGENERATIVE JOINT DISEASE POST-OP DIAGNOSIS: same PROCEDURE:  LEFT TOTAL HIP ARTHROPLASTY ANTERIOR APPROACH ANESTHESIA:  General SURGEON:  Melrose Nakayama MD ASSISTANT:  Loni Dolly PA-C and Chauncey Cruel PA-C   INDICATIONS FOR PROCEDURE:  The patient is a 53 y.o. male with a long history of a painful hip.  This has persisted despite multiple conservative measures.  The patient has persisted with pain and dysfunction making rest and activity difficult.  A total hip replacement is offered as surgical treatment.  Informed operative consent was obtained after discussion of possible complications including reaction to anesthesia, infection, neurovascular injury, dislocation, DVT, PE, and death.  The importance of the postoperative rehab program to optimize result was stressed with the patient.  SUMMARY OF FINDINGS AND PROCEDURE:  Under general anesthesia through a anterior approach an the Hana table a left THR was performed.  The patient had severe degenerative change and excellent bone quality.  We used DePuy components to replace the hip and these were size KA 10 Corail femur capped with a +1 44mm ceramic hip ball.  On the acetabular side we used a size 50 Gription shell with a plus 4 neutral polyethylene liner.  We did use a hole eliminator.  Loni Dolly PA-C assisted throughout and was invaluable to the completion of the case in that he helped position and retract while I performed the procedure.  He also closed simultaneously to help minimize OR time.  I used fluoroscopy throughout the case to check position of components and leg lengths and read all these views myself.  DESCRIPTION OF PROCEDURE:  The patient was taken to the OR suite where general anesthetic was applied.  The patient was then positioned on the Hana table supine.  All bony prominences were appropriately padded.  Prep and drape was then performed in normal sterile fashion.  The patient was given Kefzol  preoperative antibiotic and an appropriate time out was performed.  We then took an anterior approach to the right hip.  Dissection was taken through adipose to the tensor fascia lata fascia.  This structure was incised longitudinally and we dissected in the intermuscular interval just medial to this muscle.  Cobra retractors were placed superior and inferior to the femoral neck superficial to the capsule.  A capsular incision was then made and the retractors were placed along the femoral neck.  Xray was brought in to get a good level for the femoral neck cut which was made with an oscillating saw and osteotome.  The femoral head was removed with a corkscrew.  The acetabulum was exposed and some labral tissues were excised. Reaming was taken to the inside wall of the pelvis and sequentially up to 1 mm smaller than the actual component.  A trial of components was done and then the aforementioned acetabular shell was placed in appropriate tilt and anteversion confirmed by fluoroscopy. The liner was placed along with the hole eliminator and attention was turned to the femur.  The leg was brought down and over into adduction and the elevator bar was used to raise the femur up gently in the wound.  The piriformis was released with care taken to preserve the obturator internus attachment and all of the posterior capsule. The femur was reamed and then broached to the appropriate size.  A trial reduction was done and the aforementioned head and neck assembly gave [place] the best stability in extension with external rotation.  Leg lengths were felt to be about equal by  fluoroscopic exam.  The trial components were removed and the wound irrigated.  We then placed the femoral component in appropriate anteversion.  The head was applied to a dry stem neck and the hip again reduced.  It was again stable in the aforementioned position.  The would was irrigated again followed by re-approximation of anterior capsule with ethibond  suture. Tensor fascia was repaired with V-loc suture  followed by subcutaneous closure with #O and #2 undyed vicryl.  Skin was closed with staples followed by a sterile dressing.  EBL and IOF can be obtained from anesthesia records.  DISPOSITION:  The patient was extubated in the OR and taken to PACU in stable condition to be admitted to the Orthopedic Surgery for appropriate post-op care to include perioperative antibiotics and DVT prophylaxis.

## 2013-06-27 NOTE — Interval H&P Note (Signed)
History and Physical Interval Note:  06/27/2013 8:08 AM  Alexander Horne  has presented today for surgery, with the diagnosis of LEFT HIP DEGENERATIVE JOINT DISEASE  The various methods of treatment have been discussed with the patient and family. After consideration of risks, benefits and other options for treatment, the patient has consented to  Procedure(s): TOTAL HIP ARTHROPLASTY ANTERIOR APPROACH (Left) as a surgical intervention .  The patient's history has been reviewed, patient examined, no change in status, stable for surgery.  I have reviewed the patient's chart and labs.  Questions were answered to the patient's satisfaction.     Ophie Burrowes G

## 2013-06-27 NOTE — Preoperative (Signed)
Beta Blockers   Reason not to administer Beta Blockers:Not Applicable 

## 2013-06-27 NOTE — Anesthesia Preprocedure Evaluation (Addendum)
Anesthesia Evaluation  Patient identified by MRN, date of birth, ID band Patient awake    Reviewed: Allergy & Precautions, H&P , NPO status , Patient's Chart, lab work & pertinent test results  Airway Mallampati: II TM Distance: >3 FB Neck ROM: Full    Dental  (+) Dental Advisory Given, Teeth Intact   Pulmonary neg pulmonary ROS, former smoker,    Pulmonary exam normal       Cardiovascular negative cardio ROS      Neuro/Psych negative neurological ROS  negative psych ROS   GI/Hepatic negative GI ROS, Neg liver ROS,   Endo/Other  Hypothyroidism   Renal/GU negative Renal ROS     Musculoskeletal   Abdominal   Peds  Hematology   Anesthesia Other Findings   Reproductive/Obstetrics                          Anesthesia Physical Anesthesia Plan  ASA: II  Anesthesia Plan: General   Post-op Pain Management:    Induction: Intravenous  Airway Management Planned: Oral ETT  Additional Equipment:   Intra-op Plan:   Post-operative Plan: Extubation in OR  Informed Consent: I have reviewed the patients History and Physical, chart, labs and discussed the procedure including the risks, benefits and alternatives for the proposed anesthesia with the patient or authorized representative who has indicated his/her understanding and acceptance.   Dental advisory given  Plan Discussed with: CRNA, Anesthesiologist and Surgeon  Anesthesia Plan Comments:         Anesthesia Quick Evaluation

## 2013-06-27 NOTE — Progress Notes (Signed)
Utilization review completed.  

## 2013-06-28 ENCOUNTER — Encounter (HOSPITAL_COMMUNITY): Payer: Self-pay | Admitting: General Practice

## 2013-06-28 LAB — CBC
HEMATOCRIT: 30.6 % — AB (ref 39.0–52.0)
Hemoglobin: 10.6 g/dL — ABNORMAL LOW (ref 13.0–17.0)
MCH: 32.8 pg (ref 26.0–34.0)
MCHC: 34.6 g/dL (ref 30.0–36.0)
MCV: 94.7 fL (ref 78.0–100.0)
PLATELETS: 262 10*3/uL (ref 150–400)
RBC: 3.23 MIL/uL — ABNORMAL LOW (ref 4.22–5.81)
RDW: 13.3 % (ref 11.5–15.5)
WBC: 10.3 10*3/uL (ref 4.0–10.5)

## 2013-06-28 LAB — BASIC METABOLIC PANEL
BUN: 8 mg/dL (ref 6–23)
CALCIUM: 8.6 mg/dL (ref 8.4–10.5)
CO2: 27 mEq/L (ref 19–32)
Chloride: 101 mEq/L (ref 96–112)
Creatinine, Ser: 1.02 mg/dL (ref 0.50–1.35)
GFR, EST NON AFRICAN AMERICAN: 84 mL/min — AB (ref >90)
Glucose, Bld: 134 mg/dL — ABNORMAL HIGH (ref 70–99)
Potassium: 4.5 mEq/L (ref 3.7–5.3)
Sodium: 140 mEq/L (ref 137–147)

## 2013-06-28 MED ORDER — OXYCODONE-ACETAMINOPHEN 5-325 MG PO TABS
1.0000 | ORAL_TABLET | Freq: Four times a day (QID) | ORAL | Status: DC | PRN
  Administered 2013-06-28 – 2013-06-29 (×5): 2 via ORAL
  Filled 2013-06-28 (×5): qty 2

## 2013-06-28 NOTE — Progress Notes (Signed)
Subjective: 1 Day Post-Op Procedure(s) (LRB): TOTAL HIP ARTHROPLASTY ANTERIOR APPROACH (Left)  Activity level:  wbat Diet tolerance:  Eating well Voiding:  ok Patient reports pain as 3 on 0-10 scale.    Objective: Vital signs in last 24 hours: Temp:  [97 F (36.1 C)-99.8 F (37.7 C)] 99.7 F (37.6 C) (03/04 0634) Pulse Rate:  [56-96] 75 (03/04 0634) Resp:  [8-29] 16 (03/04 0634) BP: (113-143)/(61-98) 130/79 mmHg (03/04 0634) SpO2:  [93 %-100 %] 98 % (03/04 0634)  Labs:  Recent Labs  06/28/13 0450  HGB 10.6*    Recent Labs  06/28/13 0450  WBC 10.3  RBC 3.23*  HCT 30.6*  PLT 262    Recent Labs  06/28/13 0450  NA 140  K 4.5  CL 101  CO2 27  BUN 8  CREATININE 1.02  GLUCOSE 134*  CALCIUM 8.6   No results found for this basename: LABPT, INR,  in the last 72 hours  Physical Exam:  Neurologically intact ABD soft Neurovascular intact Sensation intact distally Dorsiflexion/Plantar flexion intact Incision: dressing C/D/I No cellulitis present  Assessment/Plan:  1 Day Post-Op Procedure(s) (LRB): TOTAL HIP ARTHROPLASTY ANTERIOR APPROACH (Left) Advance diet Up with therapy D/C IV fluids Plan for discharge tomorrow Discharge home with home health Switch norco to home dose oxycodone Cont ASA 325mg  x 4 weeks Follow up in office in 2 weeks    Alexander Horne, Larwance Sachs 06/28/2013, 7:46 AM

## 2013-06-28 NOTE — Progress Notes (Signed)
Physical Therapy Treatment Patient Details Name: Alexander Horne MRN: [id number] DOB: 1960-10-04 Today's Date: 06/28/2013 Time: 7867-6720 PT Time Calculation (min): 24 min  PT Assessment / Plan / Recommendation  History of Present Illness 53 yo male s/p Lt direct anterior THR   PT Comments   Pt progressing towards physical therapy goals. Was able to make good corrective changes with cueing and technique with ambulation was improved. Will continue to follow.   Follow Up Recommendations  Home health PT     Does the patient have the potential to tolerate intense rehabilitation     Barriers to Discharge        Equipment Recommendations  Rolling walker with 5" wheels    Recommendations for Other Services    Frequency 7X/week   Progress towards PT Goals Progress towards PT goals: Progressing toward goals  Plan Current plan remains appropriate    Precautions / Restrictions Precautions Precautions: None Restrictions Weight Bearing Restrictions: Yes LLE Weight Bearing: Weight bearing as tolerated   Pertinent Vitals/Pain Pt reports moderate pain throughout session however was motivated to participate.     Mobility  Bed Mobility General bed mobility comments: Pt received sitting up in chair. Transfers Overall transfer level: Needs assistance Equipment used: Rolling walker (2 wheeled) Transfers: Sit to/from Stand Sit to Stand: Supervision General transfer comment: Pt demonstrated proper hand placement and safety awareness however was impulsive in initiating transfers.  Ambulation/Gait Ambulation/Gait assistance: Min guard Ambulation Distance (Feet): 75 Feet Assistive device: Rolling walker (2 wheeled) Gait Pattern/deviations: Step-to pattern;Step-through pattern;Decreased stride length Gait velocity: Decreased Gait velocity interpretation: Below normal speed for age/gender General Gait Details: VC's for increased WB through LLE, and pt able to make corrective changes and  tolerate almost full weight bearing through the operative LE.      Exercises Total Joint Exercises Ankle Circles/Pumps: 10 reps Quad Sets: 15 reps Towel Squeeze: 15 reps Short Arc Quad: 15 reps Heel Slides: 10 reps Hip ABduction/ADduction: 15 reps Long Arc Quad: 15 reps   PT Diagnosis:    PT Problem List:   PT Treatment Interventions:     PT Goals (current goals can now be found in the care plan section) Acute Rehab PT Goals Patient Stated Goal: return home, get back to his 2 dogs PT Goal Formulation: With patient Time For Goal Achievement: 07/04/13 Potential to Achieve Goals: Good  Visit Information  Last PT Received On: 06/28/13 Assistance Needed: +1 History of Present Illness: 53 yo male s/p Lt direct anterior THR    Subjective Data  Subjective: "I need to get some rest." Patient Stated Goal: return home, get back to his 2 dogs   Cognition  Cognition Arousal/Alertness: Awake/alert Behavior During Therapy: WFL for tasks assessed/performed Overall Cognitive Status: Within Functional Limits for tasks assessed    Balance  Balance Overall balance assessment: No apparent balance deficits (not formally assessed)  End of Session PT - End of Session Equipment Utilized During Treatment: Gait belt Activity Tolerance: Patient limited by pain Patient left: in chair;with call bell/phone within reach Nurse Communication: Mobility status   GP     Jolyn Lent 06/28/2013, 2:51 PM  Jolyn Lent, PT, DPT Acute Rehabilitation Services Pager: [phone]

## 2013-06-28 NOTE — Evaluation (Signed)
Occupational Therapy Evaluation Patient Details Name: Alexander Horne MRN: [id number] DOB: [birth year] Today's Date: 06/28/2013 Time: 9833-8250 OT Time Calculation (min): 26 min  OT Assessment / Plan / Recommendation History of present illness 53 yo male s/p Lt direct anterior THR   Clinical Impression   Patient evaluated by Occupational Therapy with no further acute OT needs identified. All education has been completed and the patient has no further questions. See below for any follow-up Occupational Therapy or equipment needs. OT to sign off. Thank you for referral.      OT Assessment  Patient does not need any further OT services    Follow Up Recommendations  No OT follow up    Barriers to Discharge      Equipment Recommendations  Other (comment) (RW)    Recommendations for Other Services    Frequency       Precautions / Restrictions Precautions Precautions: None Restrictions LLE Weight Bearing: Weight bearing as tolerated   Pertinent Vitals/Pain Minimal pain  Able to complete all adls     ADL  Eating/Feeding: Modified independent Where Assessed - Eating/Feeding: Chair Grooming: Wash/dry hands;Wash/dry face;Teeth care;Supervision/safety Where Assessed - Grooming: Unsupported standing Upper Body Bathing: Chest;Right arm;Left arm;Abdomen;Supervision/safety Where Assessed - Upper Body Bathing: Unsupported sit to stand Lower Body Bathing: Supervision/safety Where Assessed - Lower Body Bathing: Unsupported sit to stand Upper Body Dressing: Supervision/safety Where Assessed - Upper Body Dressing: Unsupported standing Toilet Transfer: Supervision/safety Toilet Transfer Method: Sit to Loss adjuster, chartered: Regular height toilet Toileting - Clothing Manipulation and Hygiene: Supervision/safety Where Assessed - Best boy and Hygiene: Standing Equipment Used: Rolling walker Transfers/Ambulation Related to ADLs: Pt ambulating with heavy use  of BIL UE. pt pushing down on RW and swinging bil LE through RW face. Pt educated on pushing RW and weight bearing on BIL LE. Pt with incr gait sequence and weight bearing on RT LE this session. Pt reports "ive never used a walker before and thats what they told me to do yesterday"  ADL Comments: Pt completed bed mobility exiting on left side without deficits and this is the same side as home. Pt progressed to bathroom with RW. Pt voiding bladder standing and grooming/ bathing sink level static standing. pt ending session in chair with ice. Pt educated on transfer into van at d/c    OT Diagnosis:    OT Problem List:   OT Treatment Interventions:     OT Goals(Current goals can be found in the care plan section) Acute Rehab OT Goals Patient Stated Goal: return home, get back to his 2 dogs  Visit Information  Last OT Received On: 06/28/13 Assistance Needed: +1 History of Present Illness: 53 yo male s/p Lt direct anterior THR       Prior [place] expects to be discharged to:: Private residence Living Arrangements: Alone Available Help at Discharge: Family;Friend(s);Available PRN/intermittently Type of Home: House Home Access: Stairs to enter [employer] of Steps: 4 Entrance Stairs-Rails: Can reach both;Right;Left Home Layout: One level Home Equipment: None Prior Function Level of Independence: Independent Communication Communication: No difficulties Dominant Hand: Left         Vision/Perception Vision - History Baseline Vision: Wears glasses all the time (does not read or eat with them) Patient Visual Report: No change from baseline Vision - Assessment Vision Assessment: Vision not tested Perception Perception: Within Functional Limits Praxis Praxis: Intact   Cognition  Cognition Arousal/Alertness: Awake/alert Behavior During Therapy: WFL for tasks  assessed/performed Overall Cognitive Status: Within Functional Limits for  tasks assessed    Extremity/Trunk Assessment Upper Extremity Assessment Upper Extremity Assessment: Overall WFL for tasks assessed Lower Extremity Assessment Lower Extremity Assessment: Defer to PT evaluation Cervical / Trunk Assessment Cervical / Trunk Assessment: Normal     Mobility Bed Mobility Overal bed mobility: Modified Independent Transfers Overall transfer level: Needs assistance Equipment used: Rolling walker (2 wheeled) Transfers: Sit to/from Stand Sit to Stand: Supervision     Exercise     Balance     End of Session OT - End of Session Activity Tolerance: Patient tolerated treatment well Patient left: in chair;with call bell/phone within reach Nurse Communication: Mobility status;Precautions  GO     Peri Maris 06/28/2013, 10:21 AM Pager: [phone]

## 2013-06-28 NOTE — Progress Notes (Signed)
Physical Therapy Treatment Patient Details Name: Alexander Horne MRN: [id number] DOB: March 11, 1961 Today's Date: 06/28/2013 Time: 7829-5621 PT Time Calculation (min): 28 min  PT Assessment / Plan / Recommendation  History of Present Illness 53 yo male s/p Lt direct anterior THR   PT Comments   Pt progressing towards physical therapy goals. Limited during session due to pain from reported pinched nerve in his back. Spent end of session with positioning for comfort. Pt left in S/L with pillow in between knees and tucked behind back for support. RN notified.   Follow Up Recommendations  Home health PT     Does the patient have the potential to tolerate intense rehabilitation     Barriers to Discharge        Equipment Recommendations  Rolling walker with 5" wheels    Recommendations for Other Services    Frequency 7X/week   Progress towards PT Goals Progress towards PT goals: Progressing toward goals  Plan Current plan remains appropriate    Precautions / Restrictions Precautions Precautions: None Restrictions Weight Bearing Restrictions: Yes LLE Weight Bearing: Weight bearing as tolerated   Pertinent Vitals/Pain Increased pain at end of session (see above), RN notified for pain medication.     Mobility  Bed Mobility Overal bed mobility: Needs Assistance Bed Mobility: Supine to Sit;Sit to Supine Supine to sit: Modified independent (Device/Increase time) Sit to supine: Min assist General bed mobility comments: Pt able to transition to EOB without any physical assist, however pt with cramping and pain from a pinched nerve in his back and required min assist to elevate LE's back into bed.  Transfers Overall transfer level: Needs assistance Equipment used: Rolling walker (2 wheeled) Transfers: Sit to/from Stand Sit to Stand: Supervision General transfer comment: Pt demonstrated proper hand placement and safety awareness however was impulsive in initiating transfers.   Ambulation/Gait Ambulation/Gait assistance: Supervision;Min guard Ambulation Distance (Feet): 90 Feet Assistive device: Rolling walker (2 wheeled) Gait Pattern/deviations: Step-to pattern;Step-through pattern;Decreased stride length Gait velocity: Decreased Gait velocity interpretation: Below normal speed for age/gender General Gait Details: VC's for increased WB through LLE, and pt able to make corrective changes and tolerate almost full weight bearing through the operative LE.      Exercises Total Joint Exercises Ankle Circles/Pumps: 10 reps Quad Sets: 15 reps Towel Squeeze: 15 reps Short Arc Quad: 15 reps Heel Slides: 10 reps Hip ABduction/ADduction: 15 reps Long Arc Quad: 15 reps   PT Diagnosis:    PT Problem List:   PT Treatment Interventions:     PT Goals (current goals can now be found in the care plan section) Acute Rehab PT Goals Patient Stated Goal: return home, get back to his 2 dogs PT Goal Formulation: With patient Time For Goal Achievement: 07/04/13 Potential to Achieve Goals: Good  Visit Information  Last PT Received On: 06/28/13 Assistance Needed: +1 History of Present Illness: 53 yo male s/p Lt direct anterior THR    Subjective Data  Subjective: "I'm ready to go, let's do this." Patient Stated Goal: return home, get back to his 2 dogs   Cognition  Cognition Arousal/Alertness: Awake/alert Behavior During Therapy: WFL for tasks assessed/performed Overall Cognitive Status: Within Functional Limits for tasks assessed    Balance  Balance Overall balance assessment: No apparent balance deficits (not formally assessed) General Comments General comments (skin integrity, edema, etc.): Pt with increased pain during therapeutic exercise from a reported pinched nerve in his back. Therapeutic exercise ended early due to this.   End of Session  PT - End of Session Equipment Utilized During Treatment: Gait belt Activity Tolerance: Patient limited by pain Patient  left: in chair;with call bell/phone within reach Nurse Communication: Mobility status   GP     Jolyn Lent 06/28/2013, 4:52 PM  Jolyn Lent, PT, DPT Acute Rehabilitation Services Pager: [phone]

## 2013-06-29 LAB — CBC
HCT: 29.7 % — ABNORMAL LOW (ref 39.0–52.0)
Hemoglobin: 10.4 g/dL — ABNORMAL LOW (ref 13.0–17.0)
MCH: 33.1 pg (ref 26.0–34.0)
MCHC: 35 g/dL (ref 30.0–36.0)
MCV: 94.6 fL (ref 78.0–100.0)
PLATELETS: 252 10*3/uL (ref 150–400)
RBC: 3.14 MIL/uL — ABNORMAL LOW (ref 4.22–5.81)
RDW: 13.4 % (ref 11.5–15.5)
WBC: 11.7 10*3/uL — AB (ref 4.0–10.5)

## 2013-06-29 MED ORDER — OXYCODONE-ACETAMINOPHEN 5-325 MG PO TABS: 1.0000 | ORAL_TABLET | Freq: Four times a day (QID) | ORAL | Status: DC | PRN

## 2013-06-29 MED ORDER — ASPIRIN 325 MG PO TBEC: 325.0000 mg | DELAYED_RELEASE_TABLET | Freq: Two times a day (BID) | ORAL | Status: DC

## 2013-06-29 MED ORDER — METHOCARBAMOL 500 MG PO TABS: 500.0000 mg | ORAL_TABLET | Freq: Four times a day (QID) | ORAL | Status: DC | PRN

## 2013-06-29 NOTE — Progress Notes (Signed)
Subjective: 2 Days Post-Op Procedure(s) (LRB): TOTAL HIP ARTHROPLASTY ANTERIOR APPROACH (Left) Discharged home following therapy today. Activity level:  Weightbearing as tolerated Diet tolerance:  ok Voiding:  ok Patient reports pain as 2 on 0-10 scale.    Objective: Vital signs in last 24 hours: Temp:  [98.9 F (37.2 C)-99.8 F (37.7 C)] 99.8 F (37.7 C) (03/05 0528) Pulse Rate:  [83-106] 83 (03/05 0528) Resp:  [16] 16 (03/05 0528) BP: (104-125)/(66-80) 125/80 mmHg (03/05 0528) SpO2:  [96 %-99 %] 97 % (03/05 0528)  Labs:  Recent Labs  06/28/13 0450 06/29/13 0438  HGB 10.6* 10.4*    Recent Labs  06/28/13 0450 06/29/13 0438  WBC 10.3 11.7*  RBC 3.23* 3.14*  HCT 30.6* 29.7*  PLT 262 252    Recent Labs  06/28/13 0450  NA 140  K 4.5  CL 101  CO2 27  BUN 8  CREATININE 1.02  GLUCOSE 134*  CALCIUM 8.6   No results found for this basename: LABPT, INR,  in the last 72 hours  Physical Exam:  Neurologically intact ABD soft Neurovascular intact Sensation intact distally Intact pulses distally Dorsiflexion/Plantar flexion intact No cellulitis present Compartment soft  Assessment/Plan:  2 Days Post-Op Procedure(s) (LRB): TOTAL HIP ARTHROPLASTY ANTERIOR APPROACH (Left) Advance diet Up with therapy Discharge home with home health Discharge following therapy today. Continue ASA 3251 pill twice a day x4 weeks. Return to office in 2 weeks for recheck. Keep incision dry. May change dressing as needed. Weightbearing as tolerated no significant restrictions.    Nickol Collister R 06/29/2013, 8:39 AM

## 2013-06-29 NOTE — Discharge Summary (Signed)
Patient ID: Alexander Horne MRN: [id number] DOB/AGE: 51-May-1962 51 y.o.  Admit date: 06/27/2013 Discharge date: 06/29/2013  Admission Diagnoses:  Principal Problem:   Degenerative joint disease (DJD) of hip   Discharge Diagnoses:  Same  Past Medical History  Diagnosis Date  . Arthritis   . Cancer     thyroid cancer  . Anxiety     Surgeries: Procedure(s): TOTAL HIP ARTHROPLASTY ANTERIOR APPROACH on 06/27/2013   Consultants:    Discharged Condition: Improved  Hospital Course: Alexander Horne is an 53 y.o. male who was admitted 06/27/2013 for operative treatment ofDegenerative joint disease (DJD) of hip. Patient has severe unremitting pain that affects sleep, daily activities, and work/hobbies. After pre-op clearance the patient was taken to the operating room on 06/27/2013 and underwent  Procedure(s): TOTAL HIP ARTHROPLASTY ANTERIOR APPROACH.    Patient was given perioperative antibiotics: Anti-infectives   Start     Dose/Rate Route Frequency Ordered Stop   06/27/13 1430  ceFAZolin (ANCEF) IVPB 2 g/50 mL premix     2 g 100 mL/hr over 30 Minutes Intravenous 4 times per day 06/27/13 1214 06/27/13 1831   06/27/13 0600  ceFAZolin (ANCEF) IVPB 2 g/50 mL premix     2 g 100 mL/hr over 30 Minutes Intravenous On call to O.R. 06/26/13 1343 06/27/13 0845       Patient was given sequential compression devices, early ambulation, and chemoprophylaxis to prevent DVT.  Patient benefited maximally from hospital stay and there were no complications.    Recent vital signs: Patient Vitals for the past 24 hrs:  BP Temp Temp src Pulse Resp SpO2  06/29/13 0528 125/80 mmHg 99.8 F (37.7 C) Oral 83 16 97 %  06/29/13 0000 - - - - 16 99 %  06/28/13 2000 - - - - 16 99 %  06/28/13 1958 104/66 mmHg 99.8 F (37.7 C) Oral 106 16 99 %  06/28/13 1400 125/70 mmHg 98.9 F (37.2 C) Oral 87 16 96 %     Recent laboratory studies:  Recent Labs  06/28/13 0450 06/29/13 0438  WBC 10.3 11.7*  HGB 10.6*  10.4*  HCT 30.6* 29.7*  PLT 262 252  NA 140  --   K 4.5  --   CL 101  --   CO2 27  --   BUN 8  --   CREATININE 1.02  --   GLUCOSE 134*  --   CALCIUM 8.6  --      Discharge Medications:     Medication List         aspirin 325 MG EC tablet  Take 1 tablet (325 mg total) by mouth 2 (two) times daily after a meal.     CALCIUM 600 + D PO  Take 1 tablet by mouth daily.     levothyroxine 100 MCG tablet  Commonly known as:  SYNTHROID, LEVOTHROID  Take 1 tablet (100 mcg total) by mouth daily.     methocarbamol 500 MG tablet  Commonly known as:  ROBAXIN  Take 1 tablet (500 mg total) by mouth every 6 (six) hours as needed for muscle spasms.     OMEGA 3 PO  Take 1 capsule by mouth daily.     oxyCODONE-acetaminophen 5-325 MG per tablet  Commonly known as:  PERCOCET/ROXICET  Take 1-2 tablets by mouth every 6 (six) hours as needed for severe pain.        Diagnostic Studies: Dg Chest 2 View  06/22/2013   CLINICAL DATA:  Arthroplasty.  Preop chest.  EXAM: CHEST  2 VIEW  COMPARISON:  None.  FINDINGS: Mediastinum is normal. Left hilum is retracted superiorly suggesting scarring. Surgical clips present projected over the left upper chest. No focal infiltrate. No pleural effusion. Elevation of the left hemidiaphragm is present, possibly related to the scarring on the left. No pneumothorax. No acute bony abnormality.  IMPRESSION: No active cardiopulmonary disease. Findings suggesting scarring left lung.   Electronically Signed   By: Marcello Moores  Register   On: 06/22/2013 13:19   Dg Hip Operative Left  06/27/2013   CLINICAL DATA:  Left hip replacement surgery.  EXAM: OPERATIVE LEFT HIP  COMPARISON:  None.  FINDINGS: The femoral and acetabular components are well seated. No complicating features are demonstrated.  IMPRESSION: Well seated components of a total left hip arthroplasty.   Electronically Signed   By: Kalman Jewels M.D.   On: 06/27/2013 14:37    Disposition: 01-Home or Self Care       Discharge Orders   Future Appointments Provider Department Dept Phone   11/30/2013 10:00 AM Philemon Kingdom, MD [facility] [phone]   Future Orders Complete By Expires   Call MD / Call 911  As directed    Comments:     If you experience chest pain or shortness of breath, CALL 911 and be transported to the hospital emergency room.  If you develope a fever above 101 F, pus (white drainage) or increased drainage or redness at the wound, or calf pain, call your surgeon's office.   Constipation Prevention  As directed    Comments:     Drink plenty of fluids.  Prune juice may be helpful.  You may use a stool softener, such as Colace (over the counter) 100 mg twice a day.  Use MiraLax (over the counter) for constipation as needed.   Diet - low sodium heart healthy  As directed    Increase activity slowly as tolerated  As directed       Follow-up Information   Follow up with DALLDORF,PETER G, MD. Call in 1 week.   Specialty:  Orthopedic Surgery   Contact information:   [place] [address] [phone]        Signed: Dione Housekeeper 06/29/2013, 8:43 AM

## 2013-06-29 NOTE — Progress Notes (Signed)
Physical Therapy Treatment Patient Details Name: Alexander Horne MRN: [id number] DOB: [birth year] Today's Date: 06/29/2013 Time: 5643-3295 PT Time Calculation (min): 10 min  PT Assessment / Plan / Recommendation  History of Present Illness 53 yo male s/p Lt direct anterior THR   PT Comments   Pt standing in room packing bag upon PT arrival. Pt demonstrates improvement with pain, and modified independence with transfers, ambulation and stair negotiation. Discussed HEP and car transfer, and pt anticipates d/c this morning.   Follow Up Recommendations  Home health PT     Does the patient have the potential to tolerate intense rehabilitation     Barriers to Discharge        Equipment Recommendations  Rolling walker with 5" wheels    Recommendations for Other Services    Frequency 7X/week   Progress towards PT Goals Progress towards PT goals: Goals met/education completed, patient discharged from PT  Plan Current plan remains appropriate    Precautions / Restrictions Precautions Precautions: None Restrictions Weight Bearing Restrictions: Yes LLE Weight Bearing: Weight bearing as tolerated   Pertinent Vitals/Pain Pt reports no pain throughout session.     Mobility  Bed Mobility Overal bed mobility: Modified Independent General bed mobility comments: Increased independence this session. Transfers Overall transfer level: Modified independent General transfer comment: Pt demonstrated proper hand placement and safety awareness.  Ambulation/Gait Ambulation/Gait assistance: Modified independent (Device/Increase time) Ambulation Distance (Feet): 125 Feet Assistive device: Rolling walker (2 wheeled) Gait Pattern/deviations: WFL(Within Functional Limits) Gait velocity: Decreased Gait velocity interpretation: Below normal speed for age/gender General Gait Details: Pt demonstrated proper technique and safety with the walker. No VC's required.  Stairs: Yes Stairs assistance: Modified  independent (Device/Increase time) Stair Management: Two rails;Alternating pattern;Forwards Number of Stairs: 5 (x2) General stair comments: Pt cued for step-to pattern but was able to demonstrate safe alternating pattern with no LOB or noted knee instability.     Exercises     PT Diagnosis:    PT Problem List:   PT Treatment Interventions:     PT Goals (current goals can now be found in the care plan section) Acute Rehab PT Goals Patient Stated Goal: return home, get back to his 2 dogs PT Goal Formulation: With patient Time For Goal Achievement: 07/04/13 Potential to Achieve Goals: Good  Visit Information  Last PT Received On: 06/29/13 Assistance Needed: +1 History of Present Illness: 53 yo male s/p Lt direct anterior THR    Subjective Data  Subjective: "I'm ready to go home, just waiting on you." Patient Stated Goal: return home, get back to his 2 dogs   Cognition  Cognition Arousal/Alertness: Awake/alert Behavior During Therapy: WFL for tasks assessed/performed Overall Cognitive Status: Within Functional Limits for tasks assessed    Balance  Balance Overall balance assessment: No apparent balance deficits (not formally assessed) General Comments General comments (skin integrity, edema, etc.): Issued HEP and discussed reps/sets of each.   End of Session PT - End of Session Activity Tolerance: Patient tolerated treatment well Patient left: in chair;with call bell/phone within reach Nurse Communication: Mobility status   GP     Jolyn Lent 06/29/2013, 11:07 AM  Jolyn Lent, PT, DPT Acute Rehabilitation Services Pager: [phone]

## 2013-06-29 NOTE — Care Management Note (Signed)
CARE MANAGEMENT NOTE 06/29/2013  Patient:  Alexander Horne, Alexander Horne   Account Number:  [id number]  Date Initiated:  06/29/2013  Documentation initiated by:  Ricki Miller  Subjective/Objective Assessment:   53 yr old male s/p left total hip arthroplasty.     Action/Plan:   [facility] pre[facility] with [facility], no changes. patient has family support at discharge.   Anticipated DC Date:  06/29/2013   Anticipated DC Plan:  [place] [facility]  CM consult      [place]   Choice offered to / List presented to:  C-1 Patient   DME arranged  [place]  3-N-1      DME agency  [place] arranged  [place].   Status of service:  Completed, signed off

## 2013-11-30 ENCOUNTER — Encounter: Payer: Self-pay | Admitting: Internal Medicine

## 2013-11-30 ENCOUNTER — Ambulatory Visit (INDEPENDENT_AMBULATORY_CARE_PROVIDER_SITE_OTHER): Payer: Medicaid Other | Admitting: Internal Medicine

## 2013-11-30 VITALS — BP 110/78 | HR 77 | Temp 98.3°F | Resp 12 | Wt 159.0 lb

## 2013-11-30 DIAGNOSIS — C73 Malignant neoplasm of thyroid gland: Secondary | ICD-10-CM

## 2013-11-30 DIAGNOSIS — E890 Postprocedural hypothyroidism: Secondary | ICD-10-CM

## 2013-11-30 LAB — TSH: TSH: 0.2 u[IU]/mL — ABNORMAL LOW (ref 0.35–4.50)

## 2013-11-30 LAB — T4, FREE: FREE T4: 1.02 ng/dL (ref 0.60–1.60)

## 2013-11-30 MED ORDER — LEVOTHYROXINE SODIUM 88 MCG PO TABS: 88.0000 ug | ORAL_TABLET | Freq: Every day | ORAL | Status: DC

## 2013-11-30 NOTE — Progress Notes (Signed)
Patient ID: Alexander Horne, male   DOB: Nov 27, 1960, 53 y.o.   MRN: [id number]   HPI  Alexander Horne is a 53 y.o.-year-old male, returning for f/u for thyroid cancer in remission and  post-surgical hypothyroidism. Last visit 6 mo ago.  He had hip replacement in 06/2013 >> still has some "aches and pain".   He started iron po for anemia.  Reviewed and annotated hx - reviewed records brought today by the pt: Pt. has been dx with thyroid cancer in 05/13/2012 in [place] - pt brings records.  In 06/2012 >> patient had an incidental finding of a thyroid nodule on cervical CT performed for neck pain. This was biopsied and the result was benign, however, due to the appearance of the nodule, was suspicious, he was suggested to have surgery. He had total thyroidectomy with central compartment and modified radical neck dissection! Surgeon: Dr Philip Aspen. final pathology showed 2 foci of micro-papillary thyroid cancer, follicular variant, of 1 mm dimension, with no lymphovascular or perineural invasion and 15 lymph nodes negative for cancer. One parathyroid gland is present and also benign thymic tissue.  Pt did not have to have RAI tx afterwards. He did not have further followup.   Stage: mpT1a, pN0, pMx  He has postsurgical hypothyroidism and is now on Levothyroxine 125 mcg >> 100 mcg, taken: - fasting  - with water - eats 45-60 min b'fast  - takes calcium in the afternoon - no iron, PPIs - takes MVI with calcium  He did not return in 5 weeks for a TFT recheck after the change in LT4 as he did not get our voice message.  I reviewed pt's thyroid tests:  Lab Results  Component Value Date   TSH 0.13* 06/01/2013   FREET4 1.27 06/01/2013  04/26/2013: TSH 0.011   Pt denies feeling nodules in neck, + hoarseness - after the surgery (L vocal cord weak), dysphagia/odynophagia, SOB with lying down. Feels numbness in L neck at the surgery site.  Pt describes: - no heat/cold intolerance - resolved weight  loss (196 in 04/2009 >> 156 now). He eats a lot but does not gain weight.  - no palpitations - + fatigue - no diarrhea/ no constipation - no hair falling - no depression  ROS: Constitutional: see HPI Eyes: no blurry vision, no xerophthalmia ENT: no sore throat, no nodules palpated in throat, no dysphagia/odynophagia, no hoarseness Cardiovascular: no CP/SOB/palpitations/leg swelling Respiratory: no cough/SOB Gastrointestinal: no N/V/D/C Musculoskeletal: + muscle/+ joint aches Skin: no rashes Neurological: no tremors/numbness/tingling/dizziness  PE: BP 110/78  Pulse 77  Temp(Src) 98.3 F (36.8 C) (Oral)  Resp 12  Wt 159 lb (72.122 kg)  SpO2 97% Body mass index is 21.56 kg/(m^2).  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 Hypothyroidism  PLAN:  1. Patient with postsurgical hypothyroidism after total thyroidectomy for Microscopic follicular variant of ThyCA. He brought me records of his surgery and I reviewed these together with him. I reassured him that he is cured  of his cancer, since he only had 2 x 67mm foci of cancer that were excised - no further studies needed  2. Postsurgical hypothyroidism - he is on levothyroxine (LT4) therapy, with a recent very suppressed TSH >> decreased to 100 mcg at last visit but did not return for repeat labs - we will check TFTs today - We again discussed about correct intake of  levothyroxine, fasting, with water, separated by at least 30 minutes from breakfast, and separated by more than 4 hours from calcium, iron, multivitamins, acid reflux medications (PPIs). He is now taking it correctly- He does not appear to have a thyroid nodules, or neck compression symptoms - I will see him back in 1 year, or sooner ~ labs   Office Visit on 11/30/2013  Component Date Value Ref Range Status  . TSH 11/30/2013 0.20* 0.35 - 4.50 uIU/mL Final  . Free T4 11/30/2013 1.02  0.60 - 1.60 ng/dL Final   TSH still suppressed >> decrease LT4 to 88 mcg >> needs to return for labs in 6 weeks.

## 2013-11-30 NOTE — Patient Instructions (Signed)
Please continue the same dose of Levothyroxine until I send you the labs.  Please stop at the lab.

## 2013-12-26 ENCOUNTER — Ambulatory Visit: Payer: Self-pay | Admitting: Podiatry

## 2014-01-04 ENCOUNTER — Ambulatory Visit: Payer: Self-pay | Admitting: Podiatry

## 2014-01-16 ENCOUNTER — Ambulatory Visit (INDEPENDENT_AMBULATORY_CARE_PROVIDER_SITE_OTHER): Payer: Medicaid Other

## 2014-01-16 ENCOUNTER — Encounter: Payer: Self-pay | Admitting: Podiatry

## 2014-01-16 ENCOUNTER — Ambulatory Visit (INDEPENDENT_AMBULATORY_CARE_PROVIDER_SITE_OTHER): Payer: Medicaid Other | Admitting: Podiatry

## 2014-01-16 VITALS — BP 116/78 | HR 59 | Resp 16

## 2014-01-16 DIAGNOSIS — M7660 Achilles tendinitis, unspecified leg: Secondary | ICD-10-CM

## 2014-01-16 DIAGNOSIS — M205X2 Other deformities of toe(s) (acquired), left foot: Secondary | ICD-10-CM

## 2014-01-16 DIAGNOSIS — M778 Other enthesopathies, not elsewhere classified: Secondary | ICD-10-CM

## 2014-01-16 DIAGNOSIS — M779 Enthesopathy, unspecified: Secondary | ICD-10-CM

## 2014-01-16 MED ORDER — MELOXICAM 15 MG PO TABS: 15.0000 mg | ORAL_TABLET | Freq: Every day | ORAL | Status: AC

## 2014-01-16 MED ORDER — METHYLPREDNISOLONE (PAK) 4 MG PO TABS: ORAL_TABLET | ORAL | Status: DC

## 2014-01-16 NOTE — Progress Notes (Signed)
   Subjective:    Patient ID: Alexander Horne, male    DOB: [birth year], 53 y.o.   MRN: [id number]  HPI Comments: "I have pain in this joint"  Patient c/o aching 1st MPJ left for few years. He has had previous surgery in 2011. The area swells and gets red. The joint, visually, is very large. He is interested in orthotics and special shoes.  Foot Pain Associated symptoms include arthralgias and coughing.      Review of Systems  Respiratory: Positive for cough.   Musculoskeletal: Positive for arthralgias.  Allergic/Immunologic: Positive for food allergies.  All other systems reviewed and are negative.      Objective:   Physical Exam: I reviewed his past medical history medications allergies surgeries social history and review of systems. Pulses are strongly palpable bilateral. Neurologic sensorium is intact per Semmes-Weinstein monofilament. Deep tendon reflexes are intact bilateral muscle strength is 5 over 5 dorsiflexors plantar flexors inverters everters all intrinsic musculature is intact. Orthopedic evaluation demonstrates previous surgery to the first metatarsophalangeal joint with an elevated first metatarsal. He has an implant/joint replacement first metatarsophalangeal joint left foot. Radiographic evaluation demonstrates a distal hemiarthroplasty with an elevated first metatarsal and severe pes planus. Radiographic evaluation confirms this.        Assessment & Plan:  Assessment: Severe plantar fasciitis bilateral. Limited range of motion first metatarsophalangeal joint hallux left secondary to hallux limitus with a history of a hemiarthroplasty left. Elevated first metatarsal.  Plan: Injected Kenalog and dexamethasone with local anesthetic to the first metatarsophalangeal joint. Placed him a Medrol Dosepak. He was scan for a set of orthotics. I will followup with him once the cement.

## 2014-02-02 ENCOUNTER — Ambulatory Visit: Payer: Medicaid Other | Admitting: Internal Medicine

## 2014-02-02 ENCOUNTER — Other Ambulatory Visit (INDEPENDENT_AMBULATORY_CARE_PROVIDER_SITE_OTHER): Payer: Medicaid Other

## 2014-02-02 DIAGNOSIS — E890 Postprocedural hypothyroidism: Secondary | ICD-10-CM

## 2014-02-02 LAB — T4, FREE: FREE T4: 0.89 ng/dL (ref 0.60–1.60)

## 2014-02-02 LAB — TSH: TSH: 2 u[IU]/mL (ref 0.35–4.50)

## 2014-02-23 ENCOUNTER — Ambulatory Visit (INDEPENDENT_AMBULATORY_CARE_PROVIDER_SITE_OTHER): Payer: Medicaid Other

## 2014-02-23 DIAGNOSIS — M779 Enthesopathy, unspecified: Principal | ICD-10-CM

## 2014-02-23 DIAGNOSIS — M79673 Pain in unspecified foot: Secondary | ICD-10-CM

## 2014-02-23 DIAGNOSIS — M205X2 Other deformities of toe(s) (acquired), left foot: Secondary | ICD-10-CM

## 2014-02-23 DIAGNOSIS — M778 Other enthesopathies, not elsewhere classified: Secondary | ICD-10-CM

## 2014-02-23 NOTE — Progress Notes (Signed)
Pt is here to PUO 

## 2014-02-23 NOTE — Patient Instructions (Signed)

## 2014-07-25 ENCOUNTER — Telehealth: Payer: Self-pay | Admitting: Internal Medicine

## 2014-07-25 MED ORDER — LEVOTHYROXINE SODIUM 88 MCG PO TABS: 88.0000 ug | ORAL_TABLET | Freq: Every day | ORAL | Status: DC

## 2014-07-25 NOTE — Telephone Encounter (Signed)
Pt needs refill on levothyroxine

## 2014-07-25 NOTE — Telephone Encounter (Signed)
Rx sent to pt's request.

## 2014-07-30 ENCOUNTER — Other Ambulatory Visit: Payer: Self-pay | Admitting: Internal Medicine

## 2014-08-21 IMAGING — RF DG HIP OPERATIVE*L*
1 series · 2 of 2 positions shown · non-contrast
Comparison: None.

CLINICAL DATA: Left hip replacement surgery.

EXAM:
OPERATIVE LEFT HIP

[Series 1: run · 2 of 2 slices shown]
[im 1/2]
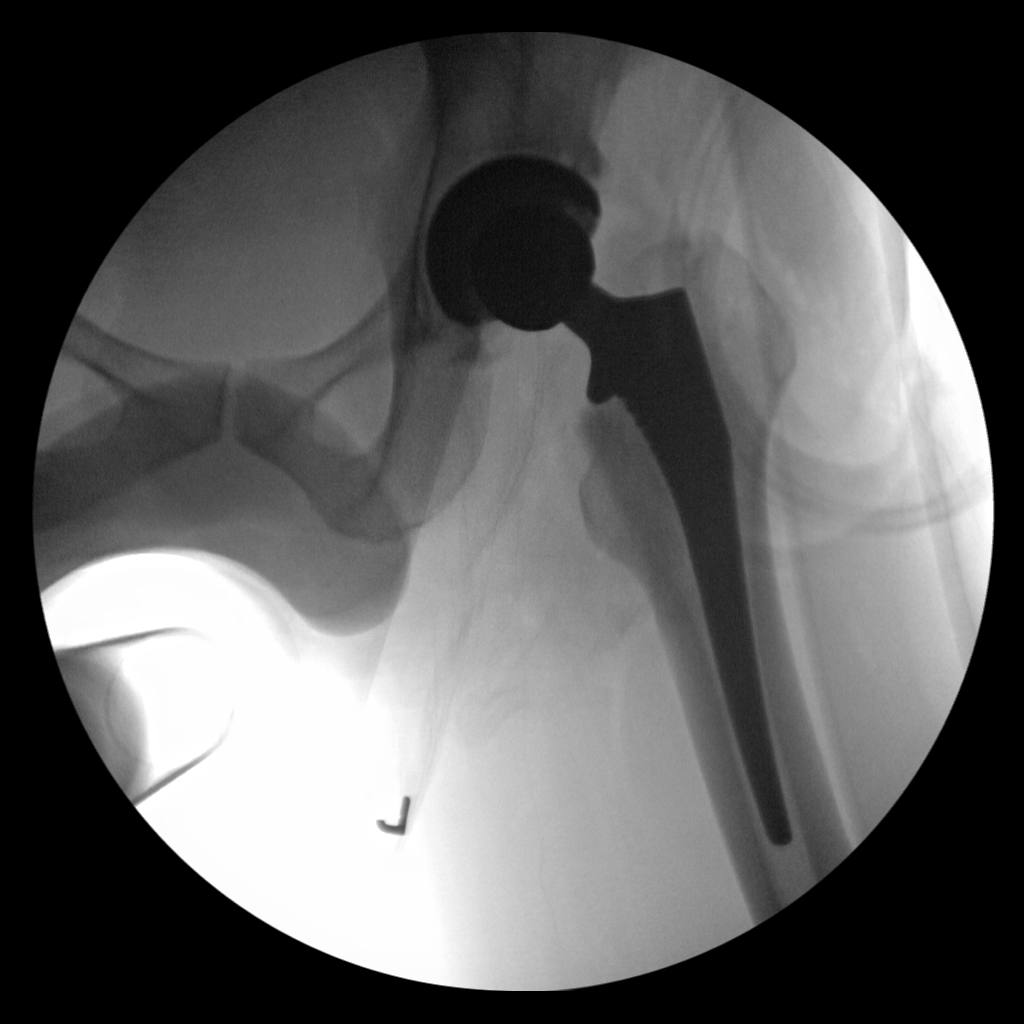
[im 2/2]
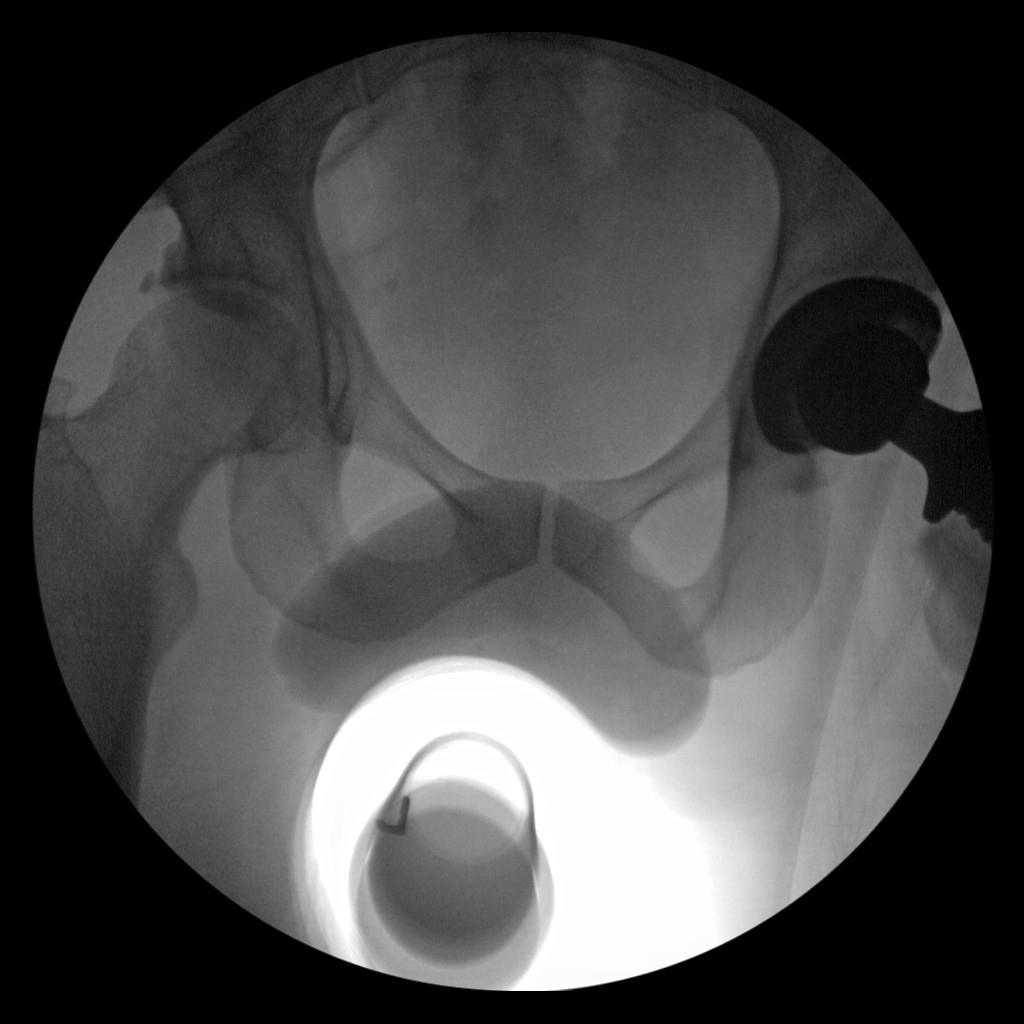

[2 of 2 positions shown; findings below may reference images not displayed]

FINDINGS: The femoral and acetabular components are well seated. No
complicating features are demonstrated.
IMPRESSION: Well seated components of a total left hip arthroplasty.

## 2014-09-04 ENCOUNTER — Other Ambulatory Visit: Payer: Self-pay | Admitting: Internal Medicine

## 2014-09-17 ENCOUNTER — Ambulatory Visit (INDEPENDENT_AMBULATORY_CARE_PROVIDER_SITE_OTHER): Payer: Medicaid Other | Admitting: Internal Medicine

## 2014-09-17 ENCOUNTER — Encounter: Payer: Self-pay | Admitting: Internal Medicine

## 2014-09-17 VITALS — BP 112/74 | HR 73 | Temp 97.7°F | Resp 12 | Wt 170.0 lb

## 2014-09-17 DIAGNOSIS — E890 Postprocedural hypothyroidism: Secondary | ICD-10-CM

## 2014-09-17 DIAGNOSIS — C73 Malignant neoplasm of thyroid gland: Secondary | ICD-10-CM

## 2014-09-17 LAB — TSH: TSH: 3.07 u[IU]/mL (ref 0.35–4.50)

## 2014-09-17 LAB — T4, FREE: Free T4: 0.81 ng/dL (ref 0.60–1.60)

## 2014-09-17 MED ORDER — LEVOTHYROXINE SODIUM 88 MCG PO TABS: ORAL_TABLET | ORAL | Status: DC

## 2014-09-17 NOTE — Progress Notes (Signed)
Patient ID: Alexander Horne, male   DOB: [birth year], 54 y.o.   MRN: [id number]   HPI  Alexander Horne is a 54 y.o.-year-old male, returning for f/u for thyroid cancer in remission and  post-surgical hypothyroidism. Last visit 9 mo ago.  Reviewed and annotated hx - reviewed records brought today by the pt: Pt. has been dx with thyroid cancer in 05/13/2012 in [place] - pt brings records.  In 06/2012 >> patient had an incidental finding of a thyroid nodule on cervical CT performed for neck pain. This was biopsied and the result was benign, however, due to the appearance of the nodule, was suspicious, he was suggested to have surgery. He had total thyroidectomy with central compartment and modified radical neck dissection! Surgeon: Dr Philip Aspen. final pathology showed 2 foci of micro-papillary thyroid cancer, follicular variant, of 1 mm dimension, with no lymphovascular or perineural invasion and 15 lymph nodes negative for cancer. One parathyroid gland is present and also benign thymic tissue.  Pt did not have to have RAI tx afterwards. He did not have further followup.   Stage: mpT1a, pN0, pMx  He has postsurgical hypothyroidism and is now on Levothyroxine 125 mcg >> 100 >> 88 mcg: - fasting  - with water - eats 45-60 min b'fast  - takes calcium in the afternoon - no iron, PPIs - takes MVI with calcium after lunch  I reviewed pt's thyroid tests:  Lab Results  Component Value Date   TSH 2.00 02/02/2014   TSH 0.20* 11/30/2013   TSH 0.13* 06/01/2013   FREET4 0.89 02/02/2014   FREET4 1.02 11/30/2013   FREET4 1.27 06/01/2013  04/26/2013: TSH 0.011   Pt denies feeling nodules in neck, + hoarseness - after the surgery (L vocal cord weak), dysphagia/odynophagia, SOB with lying down.   Pt describes: - no heat/cold intolerance - + weight gain - 11 lbs since last visit ("I like to cook and eat") - no palpitations - no fatigue - no diarrhea/ no constipation - no hair falling - no  depression  ROS: Constitutional: see HPI, + poor sleep Eyes: no blurry vision, no xerophthalmia ENT: no sore throat, no nodules palpated in throat, no dysphagia/odynophagia, no hoarseness Cardiovascular: no CP/SOB/palpitations/leg swelling Respiratory: no cough/SOB Gastrointestinal: no N/V/D/C Musculoskeletal: no muscle/joint aches Skin: no rashes Neurological: no tremors/numbness/tingling/dizziness  PE: BP 112/74 mmHg  Pulse 73  Temp(Src) 97.7 F (36.5 C) (Oral)  Resp 12  Wt 170 lb (77.111 kg)  SpO2 98% Body mass index is 23.05 kg/(m^2).  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 Hypothyroidism  PLAN:  1. Patient with postsurgical hypothyroidism after total thyroidectomy for Microscopic follicular variant of ThyCA.  - we discussed that this particular thyroid cancer type may be soon considered non-cancerous due to very good prognosis - I again reassured him that he is cured of his cancer, since he only had 2 x 39mm foci of cancer that were excised - no further studies needed  2. Postsurgical hypothyroidism -  he is on levothyroxine (LT4) therapy, with the most recent TSH normal on 88 mcg 7 mo ago - we will check TFTs today - We again discussed about correct intake of levothyroxine, fasting, with water, separated by at least 30 minutes from breakfast, and separated by more than 4 hours from calcium, iron, multivitamins, acid  reflux medications (PPIs). He is taking it correctly - He does not appear to have a thyroid nodules, or neck compression symptoms - I will see him back in 1 year, or sooner ~ labs  Office Visit on 09/17/2014  Component Date Value Ref Range Status  . TSH 09/17/2014 3.07  0.35 - 4.50 uIU/mL Final  . Free T4 09/17/2014 0.81  0.60 - 1.60 ng/dL Final   I will refill the same dose of levothyroxine, 88 g daily.

## 2014-09-17 NOTE — Patient Instructions (Signed)
Please stop at the lab.  Please return in 1 year.  

## 2015-08-20 ENCOUNTER — Other Ambulatory Visit: Payer: Self-pay | Admitting: Internal Medicine

## 2015-09-17 ENCOUNTER — Ambulatory Visit: Payer: Medicaid Other | Admitting: Internal Medicine

## 2015-09-19 ENCOUNTER — Encounter: Payer: Self-pay | Admitting: Internal Medicine

## 2015-09-19 ENCOUNTER — Ambulatory Visit (INDEPENDENT_AMBULATORY_CARE_PROVIDER_SITE_OTHER): Payer: Medicaid Other | Admitting: Internal Medicine

## 2015-09-19 VITALS — BP 128/80 | HR 92 | Temp 98.1°F | Resp 14 | Wt 163.0 lb

## 2015-09-19 DIAGNOSIS — C73 Malignant neoplasm of thyroid gland: Secondary | ICD-10-CM

## 2015-09-19 DIAGNOSIS — E890 Postprocedural hypothyroidism: Secondary | ICD-10-CM | POA: Diagnosis not present

## 2015-09-19 LAB — T4, FREE: Free T4: 0.84 ng/dL (ref 0.60–1.60)

## 2015-09-19 LAB — TSH: TSH: 5.02 u[IU]/mL — ABNORMAL HIGH (ref 0.35–4.50)

## 2015-09-19 MED ORDER — LEVOTHYROXINE SODIUM 100 MCG PO TABS: ORAL_TABLET | ORAL | Status: AC

## 2015-09-19 NOTE — Patient Instructions (Signed)
Please continue: - Levothyroxine 88 mcg daily.  Take the thyroid hormone every day, with water, at least 30 minutes before breakfast, separated by at least 4 hours from: - acid reflux medications - calcium - iron - multivitamins  Please stop at the lab.  Please return in 1 year. 

## 2015-09-19 NOTE — Progress Notes (Signed)
Patient ID: Alexander Horne, male   DOB: 1961-04-22, 55 y.o.   MRN: [id number]   HPI  Alexander Horne is a 55 y.o.-year-old male, returning for f/u for follicular variant of papillary thyroid cancer, in remission, and  post-surgical hypothyroidism. Last visit 1 year ago.  Reviewed history: Pt. has been dx with thyroid cancer in 05/13/2012 in [place] - pt brings records.  In 06/2012 >> patient had an incidental finding of a thyroid nodule on cervical CT performed for neck pain.   This was biopsied and the result was benign, however, due to the appearance of the nodule, was suspicious >> he was suggested to have surgery.   He had total thyroidectomy with central compartment and modified radical neck dissection! Surgeon: Dr Philip Aspen.  Final pathology showed 2 foci of micro-papillary thyroid cancer, follicular variant, of 1 mm dimension, with no lymphovascular or perineural invasion and 15 lymph nodes negative for cancer. One parathyroid gland is present and also benign thymic tissue.  Stage: mpT1a, pN0, pMx  Pt did not have to have RAI tx afterwards.   He has postsurgical hypothyroidism and is now on Levothyroxine 88 mcg: - fasting  - with water - eats 45-60 min b'fast  - takes calcium in the afternoon - no iron, PPIs - + MVI in pm  I reviewed pt's thyroid tests:  Lab Results  Component Value Date   TSH 3.07 09/17/2014   TSH 2.00 02/02/2014   TSH 0.20* 11/30/2013   TSH 0.13* 06/01/2013   FREET4 0.81 09/17/2014   FREET4 0.89 02/02/2014   FREET4 1.02 11/30/2013   FREET4 1.27 06/01/2013  04/26/2013: TSH 0.011   Pt denies feeling nodules in neck, + hoarseness - after the surgery (L vocal cord weak), dysphagia/odynophagia, SOB with lying down.   Pt describes: - no heat/cold intolerance - no weight gain - + weight loss - no palpitations - no fatigue - no diarrhea/ no constipation - no hair falling - no depression  ROS: Constitutional: see HPI, + poor sleep Eyes: no blurry  vision, no xerophthalmia ENT: no sore throat, no nodules palpated in throat, no dysphagia/odynophagia, no hoarseness Cardiovascular: no CP/SOB/palpitations/leg swelling Respiratory: no cough/SOB Gastrointestinal: no N/V/D/C Musculoskeletal: + muscle/ + joint aches (back pain) Skin: no rashes Neurological: no tremors/numbness/tingling/dizziness  PE: BP 128/80 mmHg  Pulse 92  Temp(Src) 98.1 F (36.7 C) (Oral)  Resp 14  Wt 163 lb (73.936 kg)  SpO2 97% Body mass index is 22.1 kg/(m^2).  Wt Readings from Last 3 Encounters:  09/19/15 163 lb (73.936 kg)  09/17/14 170 lb (77.111 kg)  11/30/13 159 lb (72.122 kg)   Constitutional: normal weight, in NAD, antalgic gait 2/2 sciatica Eyes: PERRLA, EOMI, no exophthalmos ENT: moist mucous membranes, no thyroid masses, no cervical lymphadenopathy Cardiovascular: RRR, No MRG Respiratory: CTA B Gastrointestinal: abdomen soft, NT, ND, BS+ Musculoskeletal: no deformities, strength intact in all 4 Skin: moist, warm, no rashes Neurological: no tremor with outstretched hands, DTR normal in all 4  ASSESSMENT: 1. L Microscopic follicular variant of ThyCA - in remission  2. Postsurgical Hypothyroidism  PLAN:  1. Patient with postsurgical hypothyroidism after total thyroidectomy for microscopic follicular variant of ThyCA.  - we discussed that this particular thyroid cancer type is now considered a benign tumor due to very good prognosis - I again reassured him that he is cured of his cancer, since he only had 2 x 49mm foci of cancer that were excised - no further studies needed  2. Postsurgical hypothyroidism -  he is on levothyroxine (LT4) therapy, with a normal TSH on 88 mcg a year ago - we will check TFTs today - We again discussed about correct intake of levothyroxine, fasting, with water, separated by at least 30 minutes from breakfast, and separated by more than 4 hours from calcium, iron, multivitamins, acid reflux medications (PPIs). He is  taking it correctly - He does not appear to have a thyroid nodules, or neck compression symptoms - I will see him back in 1 year, or sooner ~ labs  Office Visit on 09/19/2015  Component Date Value Ref Range Status  . Free T4 09/19/2015 0.84  0.60 - 1.60 ng/dL Final  . TSH 09/19/2015 5.02* 0.35 - 4.50 uIU/mL Final   TSH is slightly high. Since this has been trending up I will advise him to increase the levothyroxine dose to 100 g daily and recheck his TFTs in 2 months.  Philemon Kingdom, MD PhD [facility] Endocrinology

## 2015-10-24 ENCOUNTER — Other Ambulatory Visit: Payer: Self-pay | Admitting: Orthopedic Surgery

## 2015-10-24 DIAGNOSIS — M48061 Spinal stenosis, lumbar region without neurogenic claudication: Secondary | ICD-10-CM

## 2015-11-04 ENCOUNTER — Inpatient Hospital Stay: Admission: RE | Admit: 2015-11-04 | Payer: Medicaid Other | Source: Ambulatory Visit

## 2015-11-08 ENCOUNTER — Other Ambulatory Visit: Payer: Self-pay | Admitting: Orthopedic Surgery

## 2015-11-08 DIAGNOSIS — M48061 Spinal stenosis, lumbar region without neurogenic claudication: Secondary | ICD-10-CM

## 2015-11-13 ENCOUNTER — Ambulatory Visit
Admission: RE | Admit: 2015-11-13 | Discharge: 2015-11-13 | Disposition: A | Discharge status: Home / Self Care | Payer: Medicaid Other | Source: Ambulatory Visit | Attending: Orthopedic Surgery | Admitting: Orthopedic Surgery

## 2015-11-13 DIAGNOSIS — M48061 Spinal stenosis, lumbar region without neurogenic claudication: Secondary | ICD-10-CM

## 2015-11-13 MED ORDER — IOPAMIDOL (ISOVUE-M 200) INJECTION 41%
1.0000 mL | Freq: Once | INTRAMUSCULAR | Status: AC
  Administered 2015-11-13: 1 mL via EPIDURAL

## 2015-11-13 MED ORDER — METHYLPREDNISOLONE ACETATE 40 MG/ML INJ SUSP (RADIOLOG
120.0000 mg | Freq: Once | INTRAMUSCULAR | Status: AC
  Administered 2015-11-13: 120 mg via EPIDURAL

## 2015-11-13 NOTE — Discharge Instructions (Signed)

## 2016-09-18 ENCOUNTER — Ambulatory Visit: Payer: Medicaid Other | Admitting: Internal Medicine
